# Patient Record
Sex: Male | Born: 1940 | Race: White | Hispanic: No | Marital: Married | Smoking: Never smoker
Health system: Southern US, Community
[De-identification: ages and names within clinical notes are randomized; demographics above are authoritative.]

## PROBLEM LIST (undated history)

## (undated) DIAGNOSIS — E119 Type 2 diabetes mellitus without complications: Secondary | ICD-10-CM

## (undated) DIAGNOSIS — E7800 Pure hypercholesterolemia, unspecified: Secondary | ICD-10-CM

## (undated) DIAGNOSIS — I2510 Atherosclerotic heart disease of native coronary artery without angina pectoris: Secondary | ICD-10-CM

## (undated) HISTORY — PX: TONSILLECTOMY: SUR1361

## (undated) HISTORY — PX: APPENDECTOMY: SHX54

## (undated) HISTORY — DX: Pure hypercholesterolemia, unspecified: E78.00

## (undated) HISTORY — DX: Type 2 diabetes mellitus without complications: E11.9

## (undated) HISTORY — PX: OTHER SURGICAL HISTORY: SHX169

## (undated) HISTORY — DX: Atherosclerotic heart disease of native coronary artery without angina pectoris: I25.10

---

## 1999-08-07 ENCOUNTER — Ambulatory Visit (HOSPITAL_BASED_OUTPATIENT_CLINIC_OR_DEPARTMENT_OTHER): Admission: RE | Admit: 1999-08-07 | Discharge: 1999-08-07 | Payer: Self-pay | Admitting: Plastic Surgery

## 1999-08-07 ENCOUNTER — Encounter (INDEPENDENT_AMBULATORY_CARE_PROVIDER_SITE_OTHER): Payer: Self-pay | Admitting: Specialist

## 2001-07-09 ENCOUNTER — Encounter: Payer: Self-pay | Admitting: Neurology

## 2001-07-09 ENCOUNTER — Encounter: Admission: RE | Admit: 2001-07-09 | Discharge: 2001-07-09 | Payer: Self-pay | Admitting: Infectious Diseases

## 2001-07-09 ENCOUNTER — Encounter: Admission: RE | Admit: 2001-07-09 | Discharge: 2001-07-09 | Payer: Self-pay | Admitting: Neurology

## 2001-07-13 ENCOUNTER — Encounter: Admission: RE | Admit: 2001-07-13 | Discharge: 2001-07-13 | Payer: Self-pay | Admitting: Infectious Diseases

## 2001-07-13 ENCOUNTER — Encounter: Payer: Self-pay | Admitting: Neurology

## 2001-07-13 ENCOUNTER — Encounter: Admission: RE | Admit: 2001-07-13 | Discharge: 2001-07-13 | Payer: Self-pay | Admitting: Neurology

## 2001-07-29 ENCOUNTER — Encounter: Admission: RE | Admit: 2001-07-29 | Discharge: 2001-07-29 | Payer: Self-pay | Admitting: Geriatric Medicine

## 2001-07-29 ENCOUNTER — Encounter: Payer: Self-pay | Admitting: Geriatric Medicine

## 2001-08-01 ENCOUNTER — Encounter (INDEPENDENT_AMBULATORY_CARE_PROVIDER_SITE_OTHER): Payer: Self-pay | Admitting: *Deleted

## 2001-08-01 ENCOUNTER — Inpatient Hospital Stay (HOSPITAL_COMMUNITY): Admission: AD | Admit: 2001-08-01 | Discharge: 2001-08-04 | Payer: Self-pay | Admitting: Internal Medicine

## 2001-08-01 ENCOUNTER — Encounter: Payer: Self-pay | Admitting: Internal Medicine

## 2001-08-03 ENCOUNTER — Encounter: Payer: Self-pay | Admitting: Cardiology

## 2001-08-11 ENCOUNTER — Encounter: Admission: RE | Admit: 2001-08-11 | Discharge: 2001-08-11 | Payer: Self-pay | Admitting: Geriatric Medicine

## 2001-08-11 ENCOUNTER — Encounter: Payer: Self-pay | Admitting: Geriatric Medicine

## 2001-08-16 ENCOUNTER — Encounter: Admission: RE | Admit: 2001-08-16 | Discharge: 2001-11-14 | Payer: Self-pay | Admitting: Geriatric Medicine

## 2001-08-31 ENCOUNTER — Ambulatory Visit (HOSPITAL_COMMUNITY): Admission: RE | Admit: 2001-08-31 | Discharge: 2001-08-31 | Payer: Self-pay | Admitting: Interventional Cardiology

## 2003-12-08 ENCOUNTER — Encounter: Admission: RE | Admit: 2003-12-08 | Discharge: 2003-12-08 | Payer: Self-pay | Admitting: Interventional Cardiology

## 2008-07-24 ENCOUNTER — Ambulatory Visit: Payer: Self-pay | Admitting: Pulmonary Disease

## 2008-07-24 ENCOUNTER — Ambulatory Visit: Payer: Self-pay | Admitting: Cardiothoracic Surgery

## 2008-07-24 ENCOUNTER — Ambulatory Visit: Payer: Self-pay | Admitting: Internal Medicine

## 2008-07-24 ENCOUNTER — Inpatient Hospital Stay (HOSPITAL_COMMUNITY): Admission: EM | Admit: 2008-07-24 | Discharge: 2008-08-01 | Payer: Self-pay | Admitting: Emergency Medicine

## 2008-07-25 ENCOUNTER — Encounter: Payer: Self-pay | Admitting: Cardiothoracic Surgery

## 2008-07-25 ENCOUNTER — Encounter: Payer: Self-pay | Admitting: Internal Medicine

## 2008-07-29 HISTORY — PX: OTHER SURGICAL HISTORY: SHX169

## 2008-08-15 ENCOUNTER — Encounter: Payer: Self-pay | Admitting: Internal Medicine

## 2008-08-15 ENCOUNTER — Encounter: Admission: RE | Admit: 2008-08-15 | Discharge: 2008-08-15 | Payer: Self-pay | Admitting: Cardiothoracic Surgery

## 2008-08-15 ENCOUNTER — Ambulatory Visit: Payer: Self-pay | Admitting: Cardiothoracic Surgery

## 2008-08-24 ENCOUNTER — Encounter (HOSPITAL_COMMUNITY): Admission: RE | Admit: 2008-08-24 | Discharge: 2008-09-27 | Payer: Self-pay | Admitting: Interventional Cardiology

## 2008-08-26 ENCOUNTER — Emergency Department (HOSPITAL_COMMUNITY): Admission: EM | Admit: 2008-08-26 | Discharge: 2008-08-26 | Payer: Self-pay | Admitting: Emergency Medicine

## 2008-08-27 ENCOUNTER — Ambulatory Visit (HOSPITAL_COMMUNITY)
Admission: RE | Admit: 2008-08-27 | Discharge: 2008-08-27 | Payer: Self-pay | Admitting: Thoracic Surgery (Cardiothoracic Vascular Surgery)

## 2008-08-31 ENCOUNTER — Ambulatory Visit (HOSPITAL_COMMUNITY): Admission: RE | Admit: 2008-08-31 | Discharge: 2008-08-31 | Payer: Self-pay | Admitting: Cardiothoracic Surgery

## 2008-08-31 ENCOUNTER — Encounter: Admission: RE | Admit: 2008-08-31 | Discharge: 2008-08-31 | Payer: Self-pay | Admitting: Cardiothoracic Surgery

## 2008-08-31 ENCOUNTER — Ambulatory Visit: Payer: Self-pay | Admitting: Cardiothoracic Surgery

## 2008-09-04 ENCOUNTER — Encounter: Admission: RE | Admit: 2008-09-04 | Discharge: 2008-09-04 | Payer: Self-pay | Admitting: Cardiothoracic Surgery

## 2008-09-07 ENCOUNTER — Encounter: Admission: RE | Admit: 2008-09-07 | Discharge: 2008-09-07 | Payer: Self-pay | Admitting: Cardiothoracic Surgery

## 2008-09-07 ENCOUNTER — Ambulatory Visit: Payer: Self-pay | Admitting: Cardiothoracic Surgery

## 2008-09-28 ENCOUNTER — Encounter (HOSPITAL_COMMUNITY): Admission: RE | Admit: 2008-09-28 | Discharge: 2008-10-03 | Payer: Self-pay | Admitting: Interventional Cardiology

## 2008-10-06 ENCOUNTER — Encounter: Admission: RE | Admit: 2008-10-06 | Discharge: 2008-10-06 | Payer: Self-pay | Admitting: Cardiothoracic Surgery

## 2008-10-06 ENCOUNTER — Ambulatory Visit: Payer: Self-pay | Admitting: Cardiothoracic Surgery

## 2010-07-09 LAB — BASIC METABOLIC PANEL
BUN: 12 mg/dL (ref 6–23)
BUN: 12 mg/dL (ref 6–23)
BUN: 13 mg/dL (ref 6–23)
CO2: 22 mEq/L (ref 19–32)
CO2: 25 mEq/L (ref 19–32)
CO2: 30 mEq/L (ref 19–32)
Calcium: 9.1 mg/dL (ref 8.4–10.5)
Calcium: 8.1 mg/dL — ABNORMAL LOW (ref 8.4–10.5)
Calcium: 8.3 mg/dL — ABNORMAL LOW (ref 8.4–10.5)
Calcium: 8.4 mg/dL (ref 8.4–10.5)
Chloride: 106 mEq/L (ref 96–112)
Chloride: 95 mEq/L — ABNORMAL LOW (ref 96–112)
Chloride: 98 mEq/L (ref 96–112)
Creatinine, Ser: 1.07 mg/dL (ref 0.4–1.5)
Creatinine, Ser: 1.18 mg/dL (ref 0.4–1.5)
Creatinine, Ser: 1.2 mg/dL (ref 0.4–1.5)
GFR calc Af Amer: >60 mL/min (ref >60)
GFR calc Af Amer: >60 mL/min (ref >60)
GFR calc Af Amer: >60 mL/min (ref >60)
GFR calc Af Amer: >60 mL/min (ref >60)
GFR calc non Af Amer: >60 mL/min (ref >60)
GFR calc non Af Amer: >60 mL/min (ref >60)
GFR calc non Af Amer: >60 mL/min (ref >60)
GFR calc non Af Amer: >60 mL/min (ref >60)
Glucose, Bld: 118 mg/dL — ABNORMAL HIGH (ref 70–99)
Glucose, Bld: 120 mg/dL — ABNORMAL HIGH (ref 70–99)
Glucose, Bld: 132 mg/dL — ABNORMAL HIGH (ref 70–99)
Glucose, Bld: 135 mg/dL — ABNORMAL HIGH (ref 70–99)
Potassium: 3.8 mEq/L (ref 3.5–5.1)
Potassium: 3.7 mEq/L (ref 3.5–5.1)
Potassium: 4.2 mEq/L (ref 3.5–5.1)
Potassium: 4.7 mEq/L (ref 3.5–5.1)
Sodium: 133 mEq/L — ABNORMAL LOW (ref 135–145)
Sodium: 127 mEq/L — ABNORMAL LOW (ref 135–145)
Sodium: 132 mEq/L — ABNORMAL LOW (ref 135–145)
Sodium: 135 mEq/L (ref 135–145)

## 2010-07-09 LAB — DIFFERENTIAL
Basophils Absolute: 0.1 10*3/uL (ref 0.0–0.1)
Eosinophils Relative: 2 % (ref 0–5)
Lymphocytes Relative: 17 % (ref 12–46)
Lymphs Abs: 1.4 10*3/uL (ref 0.7–4.0)
Monocytes Absolute: 0.6 10*3/uL (ref 0.1–1.0)
Neutro Abs: 6 10*3/uL (ref 1.7–7.7)

## 2010-07-09 LAB — CBC
HCT: 37.2 % — ABNORMAL LOW (ref 39.0–52.0)
HCT: 29.5 % — ABNORMAL LOW (ref 39.0–52.0)
HCT: 29.6 % — ABNORMAL LOW (ref 39.0–52.0)
HCT: 32.1 % — ABNORMAL LOW (ref 39.0–52.0)
HCT: 32.4 % — ABNORMAL LOW (ref 39.0–52.0)
Hemoglobin: 12.7 g/dL — ABNORMAL LOW (ref 13.0–17.0)
Hemoglobin: 10.3 g/dL — ABNORMAL LOW (ref 13.0–17.0)
Hemoglobin: 10.5 g/dL — ABNORMAL LOW (ref 13.0–17.0)
Hemoglobin: 11.1 g/dL — ABNORMAL LOW (ref 13.0–17.0)
Hemoglobin: 11.2 g/dL — ABNORMAL LOW (ref 13.0–17.0)
MCHC: 34.4 g/dL (ref 30.0–36.0)
MCHC: 35 g/dL (ref 30.0–36.0)
MCHC: 35 g/dL (ref 30.0–36.0)
MCHC: 35.4 g/dL (ref 30.0–36.0)
MCV: 89.5 fL (ref 78.0–100.0)
MCV: 90 fL (ref 78.0–100.0)
MCV: 90.2 fL (ref 78.0–100.0)
MCV: 90.3 fL (ref 78.0–100.0)
Platelets: 189 10*3/uL (ref 150–400)
Platelets: 189 10*3/uL (ref 150–400)
Platelets: 220 10*3/uL (ref 150–400)
Platelets: 252 10*3/uL (ref 150–400)
RBC: 3.27 MIL/uL — ABNORMAL LOW (ref 4.22–5.81)
RBC: 3.31 MIL/uL — ABNORMAL LOW (ref 4.22–5.81)
RBC: 3.56 MIL/uL — ABNORMAL LOW (ref 4.22–5.81)
RBC: 3.59 MIL/uL — ABNORMAL LOW (ref 4.22–5.81)
RDW: 13.6 % (ref 11.5–15.5)
RDW: 13.6 % (ref 11.5–15.5)
RDW: 13.7 % (ref 11.5–15.5)
RDW: 13.8 % (ref 11.5–15.5)
RDW: 13.9 % (ref 11.5–15.5)
WBC: 8.3 10*3/uL (ref 4.0–10.5)
WBC: 10.4 10*3/uL (ref 4.0–10.5)
WBC: 11.1 10*3/uL — ABNORMAL HIGH (ref 4.0–10.5)
WBC: 8.7 10*3/uL (ref 4.0–10.5)
WBC: 9.4 10*3/uL (ref 4.0–10.5)

## 2010-07-09 LAB — GLUCOSE, CAPILLARY
Glucose-Capillary: 119 mg/dL — ABNORMAL HIGH (ref 70–99)
Glucose-Capillary: 135 mg/dL — ABNORMAL HIGH (ref 70–99)
Glucose-Capillary: 141 mg/dL — ABNORMAL HIGH (ref 70–99)
Glucose-Capillary: 171 mg/dL — ABNORMAL HIGH (ref 70–99)
Glucose-Capillary: 183 mg/dL — ABNORMAL HIGH (ref 70–99)
Glucose-Capillary: 214 mg/dL — ABNORMAL HIGH (ref 70–99)

## 2010-07-09 LAB — BODY FLUID CELL COUNT WITH DIFFERENTIAL
Eos, Fluid: 1 %
Lymphs, Fluid: 58 %
Monocyte-Macrophage-Serous Fluid: 7 % — ABNORMAL LOW (ref 50–90)
Neutrophil Count, Fluid: 34 % — ABNORMAL HIGH (ref 0–25)

## 2010-07-09 LAB — POCT I-STAT, CHEM 8
BUN: 15 mg/dL (ref 6–23)
HCT: 32 % — ABNORMAL LOW (ref 39.0–52.0)
Hemoglobin: 10.9 g/dL — ABNORMAL LOW (ref 13.0–17.0)
Sodium: 129 mEq/L — ABNORMAL LOW (ref 135–145)
TCO2: 22 mmol/L (ref 0–100)

## 2010-07-09 LAB — URINE CULTURE
Colony Count: NO GROWTH
Culture: NO GROWTH

## 2010-07-09 LAB — CREATININE, SERUM
Creatinine, Ser: 0.95 mg/dL (ref 0.4–1.5)
GFR calc Af Amer: >60 mL/min (ref >60)
GFR calc non Af Amer: >60 mL/min (ref >60)

## 2010-07-09 LAB — PROTIME-INR: Prothrombin Time: 14.7 seconds (ref 11.6–15.2)

## 2010-07-09 LAB — CK TOTAL AND CKMB (NOT AT ARMC)
CK, MB: 3 ng/mL (ref 0.3–4.0)
Relative Index: 1.6 (ref 0.0–2.5)
Total CK: 185 U/L (ref 7–232)

## 2010-07-09 LAB — PROTEIN, BODY FLUID: Total protein, fluid: 4.3 g/dL

## 2010-07-09 LAB — MAGNESIUM
Magnesium: 2 mg/dL (ref 1.5–2.5)
Magnesium: 2.2 mg/dL (ref 1.5–2.5)

## 2010-07-09 LAB — BODY FLUID CULTURE: Culture: NO GROWTH

## 2010-07-09 LAB — APTT: aPTT: 33 seconds (ref 24–37)

## 2010-07-10 LAB — GLUCOSE, CAPILLARY
Glucose-Capillary: 115 mg/dL — ABNORMAL HIGH (ref 70–99)
Glucose-Capillary: 121 mg/dL — ABNORMAL HIGH (ref 70–99)
Glucose-Capillary: 131 mg/dL — ABNORMAL HIGH (ref 70–99)
Glucose-Capillary: 136 mg/dL — ABNORMAL HIGH (ref 70–99)
Glucose-Capillary: 140 mg/dL — ABNORMAL HIGH (ref 70–99)
Glucose-Capillary: 142 mg/dL — ABNORMAL HIGH (ref 70–99)
Glucose-Capillary: 142 mg/dL — ABNORMAL HIGH (ref 70–99)
Glucose-Capillary: 144 mg/dL — ABNORMAL HIGH (ref 70–99)
Glucose-Capillary: 146 mg/dL — ABNORMAL HIGH (ref 70–99)
Glucose-Capillary: 150 mg/dL — ABNORMAL HIGH (ref 70–99)
Glucose-Capillary: 175 mg/dL — ABNORMAL HIGH (ref 70–99)
Glucose-Capillary: 219 mg/dL — ABNORMAL HIGH (ref 70–99)

## 2010-07-10 LAB — COMPREHENSIVE METABOLIC PANEL
ALT: 27 U/L (ref 0–53)
AST: 26 U/L (ref 0–37)
Albumin: 3.1 g/dL — ABNORMAL LOW (ref 3.5–5.2)
Alkaline Phosphatase: 53 U/L (ref 39–117)
Alkaline Phosphatase: 63 U/L (ref 39–117)
BUN: 19 mg/dL (ref 6–23)
BUN: 9 mg/dL (ref 6–23)
CO2: 20 mEq/L (ref 19–32)
CO2: 24 mEq/L (ref 19–32)
Calcium: 8.6 mg/dL (ref 8.4–10.5)
Calcium: 8.8 mg/dL (ref 8.4–10.5)
Chloride: 106 mEq/L (ref 96–112)
Creatinine, Ser: 1.17 mg/dL (ref 0.4–1.5)
GFR calc Af Amer: >60 mL/min (ref >60)
GFR calc non Af Amer: 55 mL/min — ABNORMAL LOW (ref >60)
GFR calc non Af Amer: >60 mL/min (ref >60)
Glucose, Bld: 191 mg/dL — ABNORMAL HIGH (ref 70–99)
Glucose, Bld: 247 mg/dL — ABNORMAL HIGH (ref 70–99)
Potassium: 3.8 mEq/L (ref 3.5–5.1)
Sodium: 136 mEq/L (ref 135–145)
Total Bilirubin: 0.3 mg/dL (ref 0.3–1.2)
Total Protein: 6.2 g/dL (ref 6.0–8.3)
Total Protein: 6.3 g/dL (ref 6.0–8.3)

## 2010-07-10 LAB — CBC
HCT: 34 % — ABNORMAL LOW (ref 39.0–52.0)
HCT: 34.1 % — ABNORMAL LOW (ref 39.0–52.0)
HCT: 34.8 % — ABNORMAL LOW (ref 39.0–52.0)
HCT: 35.9 % — ABNORMAL LOW (ref 39.0–52.0)
HCT: 36.1 % — ABNORMAL LOW (ref 39.0–52.0)
HCT: 38.6 % — ABNORMAL LOW (ref 39.0–52.0)
HCT: 42.5 % (ref 39.0–52.0)
Hemoglobin: 11.9 g/dL — ABNORMAL LOW (ref 13.0–17.0)
Hemoglobin: 12 g/dL — ABNORMAL LOW (ref 13.0–17.0)
Hemoglobin: 12.6 g/dL — ABNORMAL LOW (ref 13.0–17.0)
Hemoglobin: 13.3 g/dL (ref 13.0–17.0)
Hemoglobin: 14.4 g/dL (ref 13.0–17.0)
MCHC: 34 g/dL (ref 30.0–36.0)
MCHC: 34.5 g/dL (ref 30.0–36.0)
MCHC: 34.9 g/dL (ref 30.0–36.0)
MCHC: 35 g/dL (ref 30.0–36.0)
MCHC: 35.2 g/dL (ref 30.0–36.0)
MCHC: 35.3 g/dL (ref 30.0–36.0)
MCV: 89.1 fL (ref 78.0–100.0)
MCV: 89.1 fL (ref 78.0–100.0)
MCV: 89.7 fL (ref 78.0–100.0)
MCV: 90.6 fL (ref 78.0–100.0)
Platelets: 168 10*3/uL (ref 150–400)
Platelets: 173 10*3/uL (ref 150–400)
Platelets: 177 10*3/uL (ref 150–400)
Platelets: 187 10*3/uL (ref 150–400)
Platelets: 238 10*3/uL (ref 150–400)
RBC: 3.8 MIL/uL — ABNORMAL LOW (ref 4.22–5.81)
RBC: 3.8 MIL/uL — ABNORMAL LOW (ref 4.22–5.81)
RBC: 3.83 MIL/uL — ABNORMAL LOW (ref 4.22–5.81)
RBC: 4.03 MIL/uL — ABNORMAL LOW (ref 4.22–5.81)
RBC: 4.26 MIL/uL (ref 4.22–5.81)
RDW: 13.5 % (ref 11.5–15.5)
RDW: 13.6 % (ref 11.5–15.5)
RDW: 13.7 % (ref 11.5–15.5)
RDW: 13.7 % (ref 11.5–15.5)
RDW: 13.9 % (ref 11.5–15.5)
RDW: 14 % (ref 11.5–15.5)
WBC: 10.1 10*3/uL (ref 4.0–10.5)
WBC: 10.6 10*3/uL — ABNORMAL HIGH (ref 4.0–10.5)
WBC: 11.7 10*3/uL — ABNORMAL HIGH (ref 4.0–10.5)
WBC: 9.2 10*3/uL (ref 4.0–10.5)
WBC: 9.9 10*3/uL (ref 4.0–10.5)

## 2010-07-10 LAB — DIFFERENTIAL
Basophils Absolute: 0.1 10*3/uL (ref 0.0–0.1)
Basophils Relative: 1 % (ref 0–1)
Basophils Relative: 1 % (ref 0–1)
Eosinophils Relative: 2 % (ref 0–5)
Lymphocytes Relative: 19 % (ref 12–46)
Lymphs Abs: 4.7 10*3/uL — ABNORMAL HIGH (ref 0.7–4.0)
Monocytes Relative: 7 % (ref 3–12)
Neutro Abs: 6.1 10*3/uL (ref 1.7–7.7)
Neutrophils Relative %: 52 % (ref 43–77)

## 2010-07-10 LAB — POCT I-STAT 3, ART BLOOD GAS (G3+)
Acid-Base Excess: 2 mmol/L (ref 0.0–2.0)
Acid-base deficit: 1 mmol/L (ref 0.0–2.0)
Bicarbonate: 22.8 mEq/L (ref 20.0–24.0)
Bicarbonate: 23.1 mEq/L (ref 20.0–24.0)
Bicarbonate: 26.4 mEq/L — ABNORMAL HIGH (ref 20.0–24.0)
O2 Saturation: 100 %
O2 Saturation: 99 %
Patient temperature: 34.6
Patient temperature: 35.1
Patient temperature: 37.4
TCO2: 24 mmol/L (ref 0–100)
TCO2: 27 mmol/L (ref 0–100)
pCO2 arterial: 40 mmHg (ref 35.0–45.0)
pH, Arterial: 7.366 (ref 7.350–7.450)

## 2010-07-10 LAB — TYPE AND SCREEN
ABO/RH(D): B POS
Antibody Screen: NEGATIVE

## 2010-07-10 LAB — BLOOD GAS, ARTERIAL
Acid-base deficit: 1.1 mmol/L (ref 0.0–2.0)
Acid-base deficit: 1.5 mmol/L (ref 0.0–2.0)
Bicarbonate: 20.3 mEq/L (ref 20.0–24.0)
Bicarbonate: 22.2 mEq/L (ref 20.0–24.0)
Bicarbonate: 22.5 mEq/L (ref 20.0–24.0)
Drawn by: 31436
FIO2: 0.21 %
FIO2: 1 %
O2 Saturation: 96.6 %
O2 Saturation: 98 %
PEEP: 5 cmH2O
Patient temperature: 98.6
Patient temperature: 98.6
RATE: 14 resp/min
TCO2: 23.6 mmol/L (ref 0–100)
pCO2 arterial: 31.2 mmHg — ABNORMAL LOW (ref 35.0–45.0)
pCO2 arterial: 33.8 mmHg — ABNORMAL LOW (ref 35.0–45.0)
pCO2 arterial: 34.2 mmHg — ABNORMAL LOW (ref 35.0–45.0)
pH, Arterial: 7.43 (ref 7.350–7.450)
pH, Arterial: 7.439 (ref 7.350–7.450)
pO2, Arterial: 435 mmHg — ABNORMAL HIGH (ref 80.0–100.0)
pO2, Arterial: 83.1 mmHg (ref 80.0–100.0)

## 2010-07-10 LAB — URINALYSIS, ROUTINE W REFLEX MICROSCOPIC
Bilirubin Urine: NEGATIVE
Glucose, UA: NEGATIVE mg/dL
Ketones, ur: NEGATIVE mg/dL
Leukocytes, UA: NEGATIVE
Nitrite: NEGATIVE
Protein, ur: NEGATIVE mg/dL
Specific Gravity, Urine: 1.011 (ref 1.005–1.030)
Urobilinogen, UA: 1 mg/dL (ref 0.0–1.0)
pH: 7 (ref 5.0–8.0)

## 2010-07-10 LAB — BASIC METABOLIC PANEL
BUN: 18 mg/dL (ref 6–23)
BUN: 7 mg/dL (ref 6–23)
CO2: 25 mEq/L (ref 19–32)
Calcium: 8.1 mg/dL — ABNORMAL LOW (ref 8.4–10.5)
Calcium: 8.2 mg/dL — ABNORMAL LOW (ref 8.4–10.5)
Calcium: 8.4 mg/dL (ref 8.4–10.5)
Chloride: 106 mEq/L (ref 96–112)
Chloride: 107 mEq/L (ref 96–112)
Chloride: 108 mEq/L (ref 96–112)
Creatinine, Ser: 1.01 mg/dL (ref 0.4–1.5)
Creatinine, Ser: 1.08 mg/dL (ref 0.4–1.5)
GFR calc Af Amer: >60 mL/min (ref >60)
GFR calc Af Amer: >60 mL/min (ref >60)
GFR calc non Af Amer: >60 mL/min (ref >60)
GFR calc non Af Amer: >60 mL/min (ref >60)
Glucose, Bld: 151 mg/dL — ABNORMAL HIGH (ref 70–99)
Potassium: 3.6 mEq/L (ref 3.5–5.1)
Potassium: 4.1 mEq/L (ref 3.5–5.1)
Sodium: 134 mEq/L — ABNORMAL LOW (ref 135–145)

## 2010-07-10 LAB — CK TOTAL AND CKMB (NOT AT ARMC)
CK, MB: 1.4 ng/mL (ref 0.3–4.0)
CK, MB: 2.7 ng/mL (ref 0.3–4.0)
Relative Index: 1.4 (ref 0.0–2.5)
Total CK: 102 U/L (ref 7–232)

## 2010-07-10 LAB — POCT CARDIAC MARKERS: Myoglobin, poc: 127 ng/mL (ref 12–200)

## 2010-07-10 LAB — CARDIAC PANEL(CRET KIN+CKTOT+MB+TROPI)
CK, MB: 2.8 ng/mL (ref 0.3–4.0)
CK, MB: 3.5 ng/mL (ref 0.3–4.0)
Relative Index: 2.6 — ABNORMAL HIGH (ref 0.0–2.5)
Total CK: 106 U/L (ref 7–232)
Total CK: 111 U/L (ref 7–232)
Total CK: 115 U/L (ref 7–232)
Troponin I: 0.1 ng/mL — ABNORMAL HIGH (ref 0.00–0.06)

## 2010-07-10 LAB — POCT I-STAT, CHEM 8
Chloride: 111 mEq/L (ref 96–112)
Creatinine, Ser: 1.1 mg/dL (ref 0.4–1.5)
Glucose, Bld: 131 mg/dL — ABNORMAL HIGH (ref 70–99)
Potassium: 4.4 mEq/L (ref 3.5–5.1)

## 2010-07-10 LAB — POCT I-STAT 4, (NA,K, GLUC, HGB,HCT)
Glucose, Bld: 87 mg/dL (ref 70–99)
Glucose, Bld: 92 mg/dL (ref 70–99)
Glucose, Bld: 93 mg/dL (ref 70–99)
HCT: 35 % — ABNORMAL LOW (ref 39.0–52.0)
HCT: 38 % — ABNORMAL LOW (ref 39.0–52.0)
Hemoglobin: 11.9 g/dL — ABNORMAL LOW (ref 13.0–17.0)
Hemoglobin: 11.9 g/dL — ABNORMAL LOW (ref 13.0–17.0)
Potassium: 3.4 mEq/L — ABNORMAL LOW (ref 3.5–5.1)
Potassium: 3.7 mEq/L (ref 3.5–5.1)
Sodium: 137 mEq/L (ref 135–145)
Sodium: 144 mEq/L (ref 135–145)

## 2010-07-10 LAB — PHOSPHORUS
Phosphorus: 1.9 mg/dL — ABNORMAL LOW (ref 2.3–4.6)
Phosphorus: 2.4 mg/dL (ref 2.3–4.6)

## 2010-07-10 LAB — URINE MICROSCOPIC-ADD ON

## 2010-07-10 LAB — ABO/RH: ABO/RH(D): B POS

## 2010-07-10 LAB — APTT
aPTT: 23 seconds — ABNORMAL LOW (ref 24–37)
aPTT: 29 seconds (ref 24–37)
aPTT: 30 seconds (ref 24–37)

## 2010-07-10 LAB — PROTIME-INR
INR: 1.1 (ref 0.00–1.49)
INR: 1.2 (ref 0.00–1.49)
INR: 1.2 (ref 0.00–1.49)
Prothrombin Time: 14.1 seconds (ref 11.6–15.2)
Prothrombin Time: 15.6 seconds — ABNORMAL HIGH (ref 11.6–15.2)

## 2010-07-10 LAB — MAGNESIUM
Magnesium: 2 mg/dL (ref 1.5–2.5)
Magnesium: 2 mg/dL (ref 1.5–2.5)
Magnesium: 2.1 mg/dL (ref 1.5–2.5)
Magnesium: 2.7 mg/dL — ABNORMAL HIGH (ref 1.5–2.5)

## 2010-07-10 LAB — BRAIN NATRIURETIC PEPTIDE: Pro B Natriuretic peptide (BNP): 241 pg/mL — ABNORMAL HIGH (ref 0.0–100.0)

## 2010-07-10 LAB — CREATININE, SERUM
Creatinine, Ser: 1 mg/dL (ref 0.4–1.5)
GFR calc Af Amer: >60 mL/min (ref >60)
GFR calc non Af Amer: >60 mL/min (ref >60)

## 2010-07-10 LAB — LACTIC ACID, PLASMA: Lactic Acid, Venous: 1.2 mmol/L (ref 0.5–2.2)

## 2010-07-10 LAB — HEMOGLOBIN A1C: Hgb A1c MFr Bld: 6.4 % — ABNORMAL HIGH (ref 4.6–6.1)

## 2010-07-10 LAB — POCT I-STAT GLUCOSE
Glucose, Bld: 101 mg/dL — ABNORMAL HIGH (ref 70–99)
Operator id: 3390

## 2010-08-13 NOTE — Consult Note (Signed)
NAMEJDEN, [name]            ACCOUNT NO.:  [id number]   MEDICAL RECORD NO.:  [id number]          PATIENT TYPE:  INP   LOCATION:  2911                         FACILITY:  [facility]   PHYSICIAN:  Doylene Canning. Ladona Ridgel, MD    DATE OF BIRTH:  [birth year]   DATE OF CONSULTATION:  07/25/2008  DATE OF DISCHARGE:                                 CONSULTATION   REQUESTING PHYSICIAN:  Lyn Records, MD   INDICATION FOR CONSULTATION:  Evaluation of VF arrest.   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  Apparently, he  had a pleuropericarditis back in 2003 and at that time underwent  catheterization where he was found to have subtotally occluded LAD with  preserved LV function.  The patient was treated with medical therapy  though he was not able take a beta-blocker secondary to relative  bradycardia.  The patient exercises regularly and was at his fitness  center when he subsequently became short of breath and fatigue and  slumped over.  He was found pulseless.  CPR was begun and he was treated  with an automatic external defibrillator and resuscitated him from VF.  He was admitted to the hospital and he underwent urgent catheterization  demonstrating the same subtotally occluded LAD and no other tight  stenoses.  He did have very minimal wall motion abnormality by report  with overall preserved LV function.  He is referred now for additional  evaluation.  The patient denied any symptoms.  His wife was with him  states that he has been more fatigued lately, but otherwise has been  stable.  He has never had frank syncope.  He denies peripheral edema.  He denies anginal symptoms.   PAST MEDICAL HISTORY:  Notable for diabetes diagnosed in 2003.  He has a  history of spontaneous pneumothorax in the past, history of  tonsillectomy, and history of wrist fracture in the past.   FAMILY HISTORY:  Notable for mother  died of breast cancer.  Father died  of Alzheimer dementia.   SOCIAL HISTORY:  The patient is a retired Midwife.  He denies  tobacco or ethanol abuse.  He is married.   REVIEW OF SYSTEMS:  As noted in the HPI, otherwise all systems reviewed  and negative except as noted above.   PHYSICAL EXAMINATION:  GENERAL:  He is a pleasant 70 year old man who  has no evidence of any obvious residual cephalopathy.  VITAL SIGNS:  The blood pressure was 109/60, the pulse was 60 and  regular, and the respirations were 18.  HEENT:  Normocephalic and atraumatic.  Pupils equal and round.  Oropharynx moist.  Sclerae anicteric.  NECK:  No jugular venous distention.  There is no thyromegaly.  Trachea  is midline.  The carotids are 2+ and symmetric.  LUNGS:  Clear bilaterally to auscultation.  No wheezes, rales, or  rhonchi are present.  No increased work of breathing.  CARDIAC:  Regular rate and rhythm.  Normal S1 and S2.  There are no  murmurs, rubs, or gallops.  PMI  was not enlarged nor was it laterally  displaced.  ABDOMEN:  Soft and nontender.  There is no organomegaly.  EXTREMITIES:  Demonstrate no cyanosis, clubbing, or edema.  The pulses  are 2+ and symmetric.  NEUROLOGIC:  Alert and oriented x3.  His cranial nerves are intact.  His  strength is 5/5 and symmetric.   LABORATORY DATA:  Creatinine of 1.3.  White count was slightly elevated.  His troponin and MB were normal.   IMPRESSION:  1. Out-of-hospital with ventricular fibrillation arrest.  2. Coronary artery disease with chronically occluded left anterior      descending despite preserved left ventricular function.  3. Initial residual encephalopathy, now apparently totally resolved.  4. Diabetes.   DISCUSSION:  I have discussed the treatment options with the patient and  his family.  I have recommended if at all possible coronary  revascularization will be recommended.  Following this, he will require  beta-blocker therapy.  His  amiodarone which has been given to him  intravenously can be discontinued.  Following recovery, an invasive EP  study would be recommended and if negative, exercise treadmill testing  to confirm that revascularization has resulted him not having exercise-  induced ventricular arrhythmias.  We will plan to follow the patient  with you.  Thanks for this interesting consult.      Doylene Canning. Ladona Ridgel, MD  Electronically Signed     GWT/MEDQ  D:  07/25/2008  T:  07/26/2008  Job:  [id number]

## 2010-08-13 NOTE — Op Note (Signed)
NAME:  [name], [name]            ACCOUNT NO.:  [id number]   MEDICAL RECORD NO.:  [id number]          PATIENT TYPE:  INP   LOCATION:  2310                         FACILITY:  [facility]   PHYSICIAN:  Sheliah Plane, MD    DATE OF BIRTH:  1941/03/13   DATE OF PROCEDURE:  07/28/2008  DATE OF DISCHARGE:                               OPERATIVE REPORT   PREOPERATIVE DIAGNOSIS:  Coronary occlusive disease with the ventricular  fibrillation arrest.   POSTOPERATIVE DIAGNOSIS:  Coronary occlusive disease with the  ventricular fibrillation arrest.   SURGICAL PROCEDURES:  Off-pump coronary artery bypass grafting x2 with  the left internal mammary to the left anterior descending coronary  artery and reverse saphenous vein graft to the first obtuse marginal  coronary artery with right thigh endovein harvesting.   SURGEON:  Sheliah Plane, MD   FIRST ASSISTANT:  Kerin Perna, MD   SECOND ASSISTANT:  Coral Ceo, PA   BRIEF HISTORY:  The patient is a 70 year old male with known coronary  occlusive disease involving the LAD.  He has been treated medically for  the past 7 years, doing well without symptoms, had a Vfib arrest while  exercising at a local gym.  He was successfully resuscitated and  underwent repeat cardiac catheterization demonstrating 40-50% left main  obstruction and subtotal occlusion of the LAD, but with obviously  preserved anterior wall function.  It was presumed that the arrest was  ischemic mediated because of ischemia of the anterior wall, possible  loss of collateral flow that had been present previously.  Because of  the presumed ischemic arrest, coronary artery bypass grafting was  recommended.  The patient's right coronary artery was with luminal  irregularities, but no significant stenosis.  The patient agreed and  signed informed consent.  Following revascularization, Dr. Ladona Ridgel will  continue to see the patient from an EP standpoint and make further  recommendations.   DESCRIPTION OF PROCEDURE:  With Swan-Ganz and arterial line monitors in  place, the patient underwent general endotracheal anesthesia without  incidence.  Skin of chest and leg was prepped with Betadine and draped  in the usual sterile manner.  Using the Guidant endovein harvesting  system, a segment of vein was harvested from the right thigh and was of  good quality and caliber.  Median sternotomy was performed.  Left  internal mammary artery was dissected down as pedicle graft.  The distal  arteries were divided and had good free flow.  Pericardium was opened.  Overall ventricular function appeared preserved.  The patient did have  significant pericardial adhesions, probably related to episode of  pleuritis and pericarditis in 2003.  The adhesions were taken down.  The  LAD was easily identified.  The first obtuse marginal was easily  identified.  It was decided to proceed with off-pump coronary artery  bypass grafting.  The patient was systemically heparinized.  Using the  Guidant stabilization device, attention was turned first to the first  obtuse marginal vessel.  The heart was elevated and stabilized.  Elastic  tapes were placed around the obtuse marginal coronary artery.  The  vessel  was opened and was of a good lumen.  Using a running 7-0 Prolene,  distal anastomosis was performed with second reverse saphenous vein  graft.  After the distal anastomosis was completed, the heart was  allowed to return to the chest.  Partial clamp was placed on the  ascending aorta.  Single-punch aortotomy was performed.  The vein was  trimmed to appropriate length and anastomosed with a running 6-0 Prolene  to the ascending aorta.  Air was evacuated from the graft and blood flow  was restored down to the vein graft.  The heart was then again elevated  and the site of anastomosis was isolated and proximal distal control  with elastic tapes was obtained.  The vessel was opened  using a running  8-0 Prolene left internal mammary artery and anastomosed to left  anterior descending coronary artery.  Blood flow was restored down the  mammary artery.  Fascia was tacked to the epicardium.  Heart was allowed  to return to the chest and the patient remained hemodynamically stable  throughout this.  Atrial and ventricular pacing wires were applied.  A  single graft marker was applied.  Protamine was administered.  A left  pleural tube and a Blake mediastinal drain were left in place.  Sternum  was closed with #6 stainless steel wire.  Fascia was closed with  interrupted running 3-0 Vicryl and the subcutaneous tissue, 4-0  subcuticular stitch in skin edges.  Dry dressings were applied.  Sponge  and needle count was reported as correct at the completion of the  procedure.  The patient tolerated the procedure without obvious  complication and was transferred to the Surgical Intensive Care Unit for  further postop care.      Sheliah Plane, MD  Electronically Signed     EG/MEDQ  D:  07/29/2008  T:  07/29/2008  Job:  [id number]   cc:   Lyn Records, M.D.  Doylene Canning. Ladona Ridgel, MD

## 2010-08-13 NOTE — H&P (Signed)
[name], [name]            ACCOUNT NO.:  [id number]   MEDICAL RECORD NO.:  [id number]          PATIENT TYPE:  INP   LOCATION:  2911                         FACILITY:  [facility]   PHYSICIAN:  Jonathan Balm. Sung Amabile, MD   DATE OF BIRTH:  1940/05/18   DATE OF ADMISSION:  07/24/2008  DATE OF DISCHARGE:                              HISTORY & PHYSICAL   ADMISSION DIAGNOSES:  1. Status post cardiac arrest.  2. Ventilator-dependent respiratory failure.   HISTORY OF PRESENT ILLNESS:  [name].  He has a past medical history of coronary artery disease  with a cardiac catheterization in 2003 demonstrating total occlusion of  the LAD.  He has been managed medically under the care [name].  On the morning of admission, he was at the gym working out on the  treadmill and complained of shortness of breath.  He stopped exercising  and subsequently was found unresponsive in the men's locker room.  An  AED on-site was applied and recommended defibrillation which was  administered.  Upon arrival by EMS, he had a pulse with normal sinus  rhythm and spontaneous respirations.  He is transported to the emergency  department at [facility] where he was extremely  combative requiring intubation to adequately provide care.  At the  present time, he is sedated and unable to provide history.  Family  members are unable to provide any further history.  Leading up to this  event, he was in his usual state of vigorous health.   PAST MEDICAL HISTORY:  As noted above, coronary artery disease.  Prior  history and physical also documents type 2 diabetes, but that admission  history and physical was from 2003.  It is unclear whether he is on  current medications for management of that.   CURRENT MEDICATIONS:  Unknown.   SOCIAL HISTORY:  Retired Lara.  Nonsmoker.   FAMILY HISTORY:  Noncontributory and unavailable.   REVIEW OF SYSTEMS:   Otherwise, unavailable.   PHYSICAL EXAMINATION:  VITAL SIGNS:  Afebrile, blood pressure 115/60,  pulse 100 and regular, respirations on the ventilator 22 and unlabored,  and oxygen saturation 100%.  GENERAL:  He is well developed, well nourished, sedated, and  unresponsive.  HEENT:  No evidence of trauma.  Pupils react to light and are symmetric.  He has roving eye movements.  Corneal reflex is intact.  NECK:  Supple without definite jugular venous distention.  CHEST:  Normal percussion note throughout.  Breath sounds are full  without wheezes or other adventitious sounds.  CARDIAC:  Regular rate and rhythm with no murmurs.  ABDOMEN:  Soft and nontender with normal bowel sounds.  EXTREMITIES:  Without clubbing, cyanosis, or edema.  NEUROLOGIC:  Spontaneous movement of all 4 extremities.  Prior to  sedation, he was excessively combative and unable to follow commands.  Cranial nerves appear to be intact.  Deep tendon reflexes were not  tested.   LABORATORY DATA:  Chest x-ray reveals no acute cardiac or pulmonary  disease.  Chemistries are normal except for a glucose of 247.  Cardiac  markers initially are negative.  CBC is unremarkable.  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   IMPRESSION:  1. Status post arrest - per history, this sounds most consistent with      ventricular fibrillation arrest.  Cardiology has been called and      the plan is for cardiac catheterization.  Further cardiovascular      and hemodynamic management will be in the hands of [name].  2. Ventilator-dependent respiratory failure - ventilator settings have      been established and ventilator bundle has been implemented.  Deep      vein thrombosis and stress ulcer prophylaxis are ordered.  I would      anticipate early extubation.  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  I      discussed with Dr.  Tyson Lara who does not believe that hypothermia      protocol is indicated in this particular case.  Further evaluation      and prognostication might require input from our Neurology service.  4. Hyperglycemia - ICU hyperglycemia protocol will be implemented.  5. A 45 minutes of Critical Care Medicine time was used in evaluation      and stabilization of this critically ill patient and does not      include the time spent placing central venous catheterization.      Jonathan Balm Sung Amabile, MD  Electronically Signed     DBS/MEDQ  D:  07/24/2008  T:  07/25/2008  Job:  [id number]   cc:   [name], M.D.  [name], M.D.

## 2010-08-13 NOTE — Cardiovascular Report (Signed)
NAME:  [name], [name] NO.:  [id number]   MEDICAL RECORD NO.:  [id number]          PATIENT TYPE:  INP   LOCATION:  2911                         FACILITY:  [facility]   PHYSICIAN:  Lyn Records, M.D.   DATE OF BIRTH:  [birth year]   DATE OF PROCEDURE:  07/24/2008  DATE OF DISCHARGE:                            CARDIAC CATHETERIZATION   INDICATIONS:  Out-of-hospital cardiac arrest.  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  The study  is being done after he suffered an out-of-hospital cardiac arrest.  There were no acute changes on his EKG.   PROCEDURES PERFORMED:  1. Left heart catheterization.  2. Selective coronary angiography.  3. Left ventriculography.   DESCRIPTION OF PROCEDURE:  Dr. Roxan Hockey was brought emergently to the  [facility] Lab.  Both groins were sterilely prepped and draped.  A 6-  French sheath was placed in the right femoral artery.  Coronary  angiography was performed using a 6-French A2 multipurpose catheter and  preformed left and right Judkins 6-French #4 catheters.  No  complications occurred.  The angiogram generally demonstrated anatomy,  essentially identical to that from 2000 and 2003.   Angio-Seal 6-French device was used with good hemostasis.   RESULTS:  1. Hemodynamic data:      a.     Aortic pressure 103/62.      b.     Left ventricular pressure 101/14.  2. Left ventriculography:  LV function is normal.  EF is 60%.  No      regional wall motion abnormality is seen.  3. Coronary angiography.      a.     Proximal left main 40%, no catheter damping was noted.      b.     Left anterior descending coronary:  Segmental proximal to       mid 90-99% stenosis with TIMI grade 2 flow.  The apical portion of       the LAD is not visualized.  There are left-to-left and also right-       to-left collaterals to the LAD.      c.      Circumflex artery:  The circumflex coronary artery contains       50% proximal narrowing.  Three small obtuse marginals arise       distally.      d.     Ramus branch:  The ramus intermedius is a large vessel that       arises from the distal left main and is widely patent.      e.     Right coronary:  The right coronary artery is widely patent       with luminal irregularities noted throughout the proximal and mid       vessel.  Septal perforator collaterals to the LAD are noted.       There is also a conus branch collaterals to the LAD.  The distal       LAD fills  by collaterals from the distal portion of the PDA.   CONCLUSIONS:  1. Normal left ventricular size and function.  No wall motion      abnormalities noted.  Ejection fraction of 60%.  2. Coronary atherosclerosis with chronic total occlusion of the left      anterior descending collateralized left-to-left and also right-to-      left.  3. 40% to 50% proximal left main irregularities are noted in the      proximal circumflex with up to 40% narrowing.  Irregularities are      noted in the mid and distal right coronary artery.  No high-grade      obstruction other than the left anterior descending is noted.   PLAN:  1. ICU  2. Vent management and weaning.  3. Neurological assessment.  4. IV nitroglycerin.  5. IV amiodarone.  6. Consider revascularization once neurological recovery is      documented.      Lyn Records, M.D.  Electronically Signed     HWS/MEDQ  D:  07/24/2008  T:  07/25/2008  Job:  [id number]   cc:   Hal T. Stoneking, M.D.

## 2010-08-13 NOTE — Op Note (Signed)
[name], [name]            ACCOUNT NO.:  [id number]   MEDICAL RECORD NO.:  [id number]          PATIENT TYPE:  INP   LOCATION:  2018                         FACILITY:  [facility]   PHYSICIAN:  Duke Salvia, MD, FACCDATE OF BIRTH:  26-Oct-1940   DATE OF PROCEDURE:  DATE OF DISCHARGE:                               OPERATIVE REPORT   PREOPERATIVE DIAGNOSIS:  Ventricular fibrillation.   POSTOPERATIVE DIAGNOSIS:  No inducible ventricular arrhythmia.   PROCEDURE:  Invasive electrophysiological study.   Following obtaining informed consent, the patient was brought to the  electrophysiology laboratory and placed on the fluoroscopic table in the  supine position.  After routine prep and drape, cardiac catheterization  was performed with local anesthesia and conscious sedation.  Noninvasive  blood pressure monitoring and transcutaneous oxygen saturation  monitoring and end-tidal CO2 monitoring were performed continuously  throughout the procedure.  Following the procedure, the catheters were  removed.  Hemostasis was obtained, and the patient was transferred to  the floor in stable condition.   Catheters of 5-French quadripolar catheter was inserted via the left  femoral vein to the AV junction.  A 5-French quadripolar catheter was inserted via the left femoral vein  to the right ventricular apex, moved to the right ventricular outflow  tract and then to the high right atrium.   Surface leads I, aVF, and V1 were monitored continuously throughout the  procedure.  Following insertion of the catheters, a stimulation protocol  included;  1. Incremental atrial pacing.  2. Incremental ventricular pacing.  3. Single and double atrial extrastimuli at a paced cycle length of      600 msec.  4. Single and double ventricular extrastimuli from the right      ventricular apex at a paced cycle length of 400:550 msec.  5. Double and triple ventricular extrastimuli from the right      ventricular  apex and the right ventricular outflow tract at paced      cycle length of 500 and 400 msec.   END-TIDAL RESULTS:  End-tidal surface electrocardiogram.  Rhythm:  Sinus.  RR interval:  810 msec;  PR interval:  187 msec;  QRS duration:  74 msec;  QT interval:  346 msec;  AH interval:  112 msec; HV interval:  48 msec.  AV nodal function:  AV Wenckebach was 360 msec.  VA conduction was dissociated at 600 msec.  AV nodal effective refractory period with a paced cycle length of 600  msec was less than or equal to 280 msec.  PR interval was longer than RR during the atrial incremental pacing.  Ventricular response to programmed stimulation:  The effective  refractory period of the paced cycle length of 550 msec from right  ventricular apex was 240 msec and at 400 msec was 230 msec.   The effective refractory period from the right ventricular outflow tract  at the paced cycle length of 500 msec was 270 msec and at 400 msec was  230 msec.   The coupling interval at the right ventricular apex and a paced cycle  length of 550 was 250, 200, 210 and  at 400 msec was 240, 200, 200.  The coupling interval at the right ventricular outflow tract and a paced  cycle length of 500 msec was 260, 210, 200, and at 400 msec was 230,  200, 200.   IMPRESSION:  1. Normal sinus function.  2. Normal atrial function.  3. Normal atrioventricular nodal function with evidence of PR longer      than RR and no VA conduction.  4. Normal His-Purkinje system function.  5. No accessory pathway.  6. Normal ventricular response to programmed stimulation.   SUMMARY:  In conclusion, the results of electrophysiological testing  failed to demonstrate a persistent substrate for malignant ventricular  arrhythmias.  The patient will be maintained on medical therapy and no  device implantation will be undertaken.      Duke Salvia, MD, [facility]  Electronically Signed     SCK/MEDQ  D:  07/31/2008  T:  08/01/2008   Job:  [id number]

## 2010-08-13 NOTE — Consult Note (Signed)
NAME:  [name], [name] NO.:  [id number]   MEDICAL RECORD NO.:  [id number]          PATIENT TYPE:  INP   LOCATION:  2911                         FACILITY:  [facility]   PHYSICIAN:  Sheliah Plane, MD    DATE OF BIRTH:  1940/07/05   DATE OF CONSULTATION:  07/25/2008  DATE OF DISCHARGE:                                 CONSULTATION   REQUESTING PHYSICIAN:  Lyn Records, MD   FOLLOWUP CARDIOLOGIST:  Lyn Records, MD   PRIMARY CARE PHYSICIAN:  Hal T. 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  He has been going to sport time to  exercise on a nearly daily basis, and yesterday after increasing his  usual exercise regimen to some degree by moving to stairmaster, had no  symptoms while exercising but after exercising went into the restroom  and collapsed, and AICD was in the facility and used quickly, a shock  was delivered and the review of the device showed the patient was found  to be in ventricular fibrillation.  He was somewhat combative in  transport, ultimately ended up being intubated to obtain a CT scan of  the head, which was clear.  He also underwent cardiac catheterization by  Dr. Verdis Prime yesterday.  He is now extubated and oriented.  Consideration for coronary artery bypass grafting is entertained and/or  consult requested by Dr. Katrinka Blazing.  He has had no prior history of known  myocardial infarction.  He does have a history of hypertension; history  of hyperlipidemia, well controlled, currently on Vytorin; and was  diagnosed with diabetes in 2003, hemoglobin A1c has been running between  5 and 7 on Actos and diet.  He has had no previous stroke, no  claudication, no renal insufficiency.   PAST MEDICAL HISTORY:  Significant for:  1. New  diagnosis of diabetes mellitus in 2003.  2. History of cardiac catheterization in 2003, found to have patent      coronary vessels with exception of the LAD, which was occluded and      filled by collaterals.  3. In 2003, the patient had approximately 55-month history of shortness      of breath, bilateral pneumonias, notes he was diagnosed with      pericarditis and a significant left pleural effusion.  This has      gradually resolved.   PAST SURGICAL HISTORY:  Significant for tonsillectomy, appendectomy.  He  had a spontaneous pneumothorax on the right side, treated with a chest  tube in 1963.   SOCIAL HISTORY:  The patient is married, a retired Midwife.   CURRENT MEDICATIONS:  1. Aspirin 81 mg day.  2. Actos 30 mg a day.  3. Vytorin 10/80.  4. Multivitamins.  However, the patient was not given any Plavix.   DRUG ALLERGIES:  TETRACYCLINE, which causes headache.   REVIEW OF SYSTEMS:  CARDIAC:  Negative for  chest pain.  Negative for  exertional shortness of breast.  Positive for syncopal episode  yesterday.  Denies presyncope, orthopnea, palpitation, or lower  extremity edema.  GENERAL:  The patient has noted some fatigue over the past several  months but denies chest pain or shortness of breath, denies fever,  chills, or night sweats.  No change in bowel habits.  No blood in his  urine or stool.  Denies psychiatric history.  Does have diabetes as  noted above.  Denies any easy bruisability.  Other than episodes in 2003  of question of pneumonia, he has had no infectious symptoms.  Other  review of systems are negative.   PHYSICAL EXAMINATION:  VITAL SIGNS:  The patient's weight is 82 kg,  height estimated at 5 feet 9 inch, blood pressure is 105/56, sinus  rhythm at 73, currently on amiodarone drip.  GENERAL:  The patient is awake, alert, neurologically intact, extubated  this morning, does not have carotid bruits.  LUNGS:  Clear bilaterally.  CARDIAC:  Regular rate and  rhythm.  ABDOMEN:  Benign.  LOWER EXTREMITIES:  Without edema.  He has a right subclavian vein  catheter currently in place.   LABORATORY FINDINGS:  White count of 11,000, hematocrit of 42, platelet  count 287,000.  Creatinine is 1.3.   Cardiac catheterization films were reviewed and discussed with Dr.  Katrinka Blazing.  He does have a minor degree of left main disease, but the vessel  is widely patent, estimated at 20-30% luminal irregularities in the  circumflex and right coronary artery, normal LV function, subtotal  occlusion of the LAD with distal filling of the vessel 90-99%.  There is  collateral filling of the distal LAD.   IMPRESSION:  The patient with presumed ischemic arrest with exercise  with subtotal occlusion of the left anterior descending, presumably  ischemic with exercise.  I discussed the case with Dr. Katrinka Blazing and agree  with recommendation to proceed with coronary artery bypass grafting to  the left anterior descending coronary artery and then discussed with the  Electrophysiology, with the patient not ischemic, an electrophysiology  study versus defibrillator placement postoperatively.  We will plan on  proceeding with surgery on this admission.  The patient currently is  medically stable and will give several days to recover from this episode  yesterday.  The risks of surgery including death, infection, stroke,  myocardial infarction, bleeding, blood transfusion all discussed with  the patient in detail, and he is willing to proceed.      Sheliah Plane, MD  Electronically Signed     EG/MEDQ  D:  07/25/2008  T:  07/26/2008  Job:  [id number]   cc:   Lyn Records, M.D.  Doylene Canning. Ladona Ridgel, MD

## 2010-08-13 NOTE — Assessment & Plan Note (Signed)
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  The patient did well  postoperatively.  He had originally presented with ventricular  fibrillation and local exercise facility was successfully resuscitated.  Postoperatively, he had an EP study by Dr. Ladona Ridgel and was not inducible,  so was discharged home on beta-blocker.  Since discharge, he has been  doing well.  I noticed he has had no chest pain or syncope or evidence  of congestive heart failure.  His respiratory status continues to  improve.  His overall endurance continues to improve.   PHYSICAL EXAMINATION:  On exam, his blood pressure is 102/62, pulse is  60, respiratory rate is 18, O2 sats 95%.  Sternum is stable and well  healed.  He has slight decrease in breath sounds on the left base, clear  on the right.  He has no pedal edema.  The right thigh endovein  harvesting site is healing well.   MEDICATIONS:  He continues on aspirin 325 mg a day, Toprol-XL 25 a day,  Actos 30 a day, Vytorin 10/80, multivitamins, Uroxatral, Colace p.r.n.,  and oxycodone p.r.n.   IMPRESSION AND PLAN:  He has a slightly increased left pleural effusion  and some left basilar atelectasis.  He has a history in 2003 requiring  thoracentesis after inflammatory process involving both chest with  moderate-sized left pleural effusion and probable pericarditis.  Currently, he has no symptoms of pericarditis.  The left pleural  effusion appears small.  He continues to diurese well.  At this point,  we will make no specific  intervention but in 2-3 weeks, we will have him  return to the office with a followup chest x-ray to make sure that the  effusion is gradually decreasing.  I have discussed this with him.  He  is aware of the fusion and will contact  us immediately should he note any increasing shortness of breath.  Plan  to see him back in 2-3 weeks.   Sheliah Plane, MD  Electronically Signed   EG/MEDQ  D:  08/15/2008  T:  08/16/2008  Job:  [id number]   cc:   Lyn Records, M.D.  Doylene Canning. Ladona Ridgel, MD

## 2010-08-13 NOTE — Assessment & Plan Note (Signed)
OFFICE VISIT   [name], [name]  DOB:  May 11, 1940                                        October 06, 2008  CHART #:  [id number]   The patient returns to the office today in followup after recent Vfib  and cardiac arrest and subsequent coronary artery bypass grafting x2,  which was done on July 28, 2008.  He has now completed his cardiac  rehab.  Noted that he had a high level stress test done last week with a  heart rate to 148 and was told that he had no ischemic changes.  His  wife note that he was fatigued following this.  He has had no angina,  continues on beta-blocker.  He returns to the office today for followup  chest x-ray because of early postoperative effusions to ensure that it  had completely resolved.   PHYSICAL EXAMINATION:  VITAL SIGNS:  His blood pressure 112/71, pulse  56, respiratory rate 16, and O2 sats 95%.  LUNGS:  Clear bilaterally.  CHEST:  Sternum is stable and well healed.  EXTREMITIES:  He has no pedal edema.   Followup chest x-ray shows almost total resolution of the left pleural  effusion.   Overall, I am pleased with his progress.  I have not made him a return  appointment to see me, but would be glad to see him at his or Dr.  Michaelle Copas request at anytime.   Sheliah Plane, MD  Electronically Signed   EG/MEDQ  D:  10/06/2008  T:  10/07/2008  Job:  [id number]   cc:   Lyn Records, M.D.

## 2010-08-13 NOTE — Consult Note (Signed)
NAME:  ROBBY, BULKLEY NO.:  [id number]   MEDICAL RECORD NO.:  [id number]          PATIENT TYPE:  INP   LOCATION:  2911                         FACILITY:  [facility]   PHYSICIAN:  Lyn Records, M.D.   DATE OF BIRTH:  1940-08-22   DATE OF CONSULTATION:  07/24/2008  DATE OF DISCHARGE:                                 CONSULTATION   CHIEF COMPLAINT:  Cardiac arrest.   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  Normal EF.  Apparently, he was at the gym today on the treadmill  and started feeling more short of breath than usual.  He went into the  locker room and then was found unresponsive.  An AED was applied, and a  shockable rhythm was identified.  He was defibrillated and brought to  [facility] Emergency Room, intubated and sedated.  EKG is not acute.  He is  being taken to the catheterization lab for possible intervention.  The  patient is intubated and sedated, I cannot gather complete information  at this time.   PAST MEDICAL HISTORY:  1. CAD.  His last cath in 2003.  2. Diabetes mellitus.  3. Spontaneous pneumothorax secondary to bleb.  4. History of tonsillectomy and appendectomy.   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  His son is present.   FAMILY HISTORY:  Mom died of breast cancer at age 19.  Dad died at age  64 with Alzheimer disease.   ALLERGIES:  We have uncovered is that he has a headache with  TETRACYCLINE.   CURRENT MEDICATIONS:  Unknown.   PHYSICAL EXAMINATION:  GENERAL:  He is intubated and sedated.  VITAL SIGNS:  Pulse 75, respirations 14, and blood pressure 131/66.  HEENT:  Grossly normal.  HEART:  Regular rate and rhythm.  No gross murmur.  LUNGS:  Clear to  auscultation bilaterally.  ABDOMEN:  Nondistended.  No obvious tenderness.  EXTREMITIES:  No peripheral edema.  NEUROLOGIC:  Again sedated.   Head CT is negative for any acute process.  Chest x-ray shows  atelectasis changes in the left base.   LABORATORY DATA:  Hemoglobin 14.4, hematocrit 42.5, white count 11.8,  and platelets 287.  Sodium 136, potassium 3.8, BUN 19, creatinine 1.30,  glucose 247.  Point-of-care markers negative x1.   This is a 70 year old gentleman with out-of-hospital cardiac arrest.  It  is felt that he has a cardiac source as the reason for his arrest today,  therefore, he is being taken to the cardiac catheterization lab on an  emergent basis.  Permission has been obtained from his son to go ahead  and proceed with emergent procedure.       Guy Franco, P.A.      Lyn Records, M.D.  Electronically Signed    LB/MEDQ  D:  07/24/2008  T:  07/25/2008  Job:  [id number]   cc:   Lyn Records, M.D.

## 2010-08-13 NOTE — Procedures (Signed)
EEG NUMBER:  [id number]   CLINICAL HISTORY:  The patient is a 70 year old admitted after being  found unresponsive on a locked room floor after working out.  He  received defibrillation on his way to the hospital and coming to, he was  combative.  He was intubated, was able to be extubated for the EEG and  is alert and oriented.  Study is being done to look for the presence of  seizures (780.2).   PROCEDURE:  The tracing is carried out on a 32-channel digital Cadwell  recorder reformatted into 16-channel montages with one devoted to EKG.  The patient was awake and drowsy during the recording.  The  [employer] 10/20 system lead placement was used.   MEDICATIONS:  Ativan, aspirin, NovoLog, Protonix, Versed, Cordarone,  fentanyl.   DESCRIPTION OF FINDINGS:  Dominant frequency is a 9-10 Hz alpha range  activity that is well regulated.  The patient comes drowsy with mixed  frequency theta and beta range activity and upper delta range activity.  There is no focal slowing.  There is no interictal epileptiform activity  in the form of spikes or sharp waves.   EKG showed a regular sinus rhythm with ventricular response of 72 beats  per minute.   IMPRESSION:  Normal record with the patient awake and drowsy.      Deanna Artis. Sharene Skeans, M.D.  Electronically Signed     TDV:VOHY  D:  07/25/2008 22:07:32  T:  07/26/2008 04:29:20  Job #:  [id number]   cc:   Pramod P. Pearlean Brownie, MD  Fax: [phone]

## 2010-08-13 NOTE — Assessment & Plan Note (Signed)
OFFICE VISIT   SILVIA, MARKUSON  DOB:  1940/04/06                                        September 07, 2008  CHART #:  [id number]   The patient returns to the office today for followup chest x-ray after  he was seen in the office last week with persistent left pleural  effusion.  One week ago, the effusion was tapped and additional 1500 mL  was withdrawn.  Since that time, he has felt much better, involved in  cardiac rehab without difficulty.  Denies any chest pain or shortness of  breath.  He returns today for followup chest x-ray.   PHYSICAL EXAMINATION:  VITAL SIGNS:  Blood pressure 115/67, pulse is 58,  respiratory rate is 18, and O2 sats 97%.  LUNGS:  Clear bilaterally without wheezing.  CHEST:  Sternum is stable and well healed.  EXTREMITIES:  He has no pedal edema.   Followup chest x-ray shows clear lung fields without any evidence of re-  accumulation of effusion.  Overall, he is much improved; wishes not to  continue full 12 weeks of cardiac rehab, but start on his own.  I have  encouraged him to wait until after he sees Dr. Katrinka Blazing in 2 weeks and  decide on followup stress test before exercising on his own, not in  cardiac rehab.  Overall, I am very pleased with his progress.  I will  plan to see him back between 4 and 6 weeks with a followup chest x-ray.  He is aware of the symptoms of increasing pleural effusion and will  contact us immediately should he has this.   Sheliah Plane, MD  Electronically Signed   EG/MEDQ  D:  09/07/2008  T:  09/07/2008  Job:  [id number]   cc:   Lyn Records, M.D.

## 2010-08-13 NOTE — Discharge Summary (Signed)
[name], [name]            ACCOUNT NO.:  [id number]   MEDICAL RECORD NO.:  [id number]          PATIENT TYPE:  INP   LOCATION:  2018                         FACILITY:  [facility]   PHYSICIAN:  Sheliah Plane, MD    DATE OF BIRTH:  05-Oct-1940   DATE OF ADMISSION:  07/24/2008  DATE OF DISCHARGE:  08/01/2008                               DISCHARGE SUMMARY   PRIMARY ADMITTING DIAGNOSIS:  Cardiac arrest.   ADDITIONAL/DISCHARGE DIAGNOSES:  1. Ventricular fibrillation cardiac arrest.  2. Recurrent coronary artery disease.  3. History of diabetes mellitus.  4. Prior history of coronary artery disease with a history of total      occlusion of the left anterior descending.  5. Hyperlipidemia.  6. Ventilator-dependent respiratory failure.   PROCEDURES PERFORMED:  1. Cardiac catheterization.  2. Off-pump coronary artery bypass grafting x2 (left internal mammary      artery to the LAD, saphenous vein graft to the second obtuse      marginal).  3. Endoscopic vein harvest, right thigh.   HISTORY:  The patient is a 70 year old male with a known history of  coronary artery disease.  He is status post previous cardiac  catheterization in 2003 which showed a total occlusion of the LAD.  He  had been managed medically since that time and is followed by Dr. Verdis Prime.  On the morning of his admission, he was working out at the gym  on the treadmill and developed shortness of breath.  He stopped  exercising and subsequently was found unresponsive in the men's locker  room.  An AED OnSite was applied, and the patient was defibrillated x1.  EMS found him with a pulse and normal sinus rhythm and spontaneous  respirations.  He was subsequently transferred to [facility]  and was admitted for further evaluation and treatment.   HOSPITAL COURSE:  Upon arrival to the ED at [facility], he was found to  be combative and unable to protect his airway.  He did require  intubation and was  subsequently followed by Pulmonary and Critical Care  Medicine for ventilator management.  He was also seen by Cardiology and  it was felt that he should undergo cardiac catheterization.  This was  performed on July 24, 2008, and he was found to have chronically  occluded LAD, 40% left main stenosis, patent right coronary artery and  circumflex systems.  Ejection fraction was around 60% with no wall  motion abnormalities.  It was felt that once he stabilize he would  ultimately require coronary artery bypass surgery for definitive  management.  He remained stable and was extubated on July 25, 2008.  He  was followed by Neurology to assess for any focal deficits following his  loss of consciousness and cardiac arrest.  He underwent an EEG which was  normal.  He was found to be back in his baseline mental status following  extubation.  He was seen in consultation by Dr. Sheliah Plane and  films were reviewed.  Dr. Tyrone Sage agreed that he would benefit from  surgical revascularization at this time.  He explained  all risks,  benefits, and alternatives of surgery to the patient, he agreed to  proceed.  It was also felt that during his admission he should be  evaluated for possible AICD placement.  Dr. Lewayne Bunting saw the patient  and felt that he should initially proceed with his CABG surgery and  undergo an EP study during his postoperative recovery.  All risks,  benefits, and alternatives of surgery were explained to the patient, and  he agreed to proceed.  His preoperative workup showed normal ABIs and  carotid Dopplers which were negative for ICA stenosis.  He was taken to  the operating room on July 28, 2008, and underwent off-pump coronary  artery bypass grafting x2 as described above.  Please see previously  dictated operative report for complete details of surgery.  He tolerated  the procedure well and was transferred to the SICU in stable condition.  He was extubated shortly  after surgery.  He was hemodynamically stable  and doing well on postop day #1.  At that time, his tubes and monitoring  devices were removed.  He was kept in the unit until postop day 2.  At  that point, he was stable and doing well and was able to be transferred  to the step-down unit.  Postoperatively, he has been restarted on a  statin as well as aspirin and started on a beta-blocker.  He has been  spontaneously diuresing and is back down around his preoperative weight  with no significant evidence of volume overload on physical exam.  He  has been followed postoperatively by Dr. Ladona Ridgel and has undergone an EP  study on Jul 31, 2008.  There was no evidence of inducible V-tach of V-  fib, and it was felt that he would not need further evaluation or  placement of an ICD at this time.  Overall, his postoperative course has  been uneventful.  He has been ambulating in the halls without difficulty  with Cardiac rehab phase 1 as well as independently.  He has remained  afebrile and vital signs have been stable.  His incisions are all  healing well.  His blood sugars have been under good control on his home  medications.  His labs on postop day 3 showed a hemoglobin of 10.3,  hematocrit 29.5, platelets 189, white count 8.7, sodium 132, potassium  4.7, BUN 12, creatinine 1.18.  Chest x-ray showed right basilar  atelectasis.  Since he has progressed well and has had no postoperative  complications, it is felt that he can be discharged home on postop day 4  which is Aug 01, 2008.   Discharge medications are as follows.  1. Enteric-coated aspirin 325 mg daily.  2. Toprol-XL 25 mg daily.  3. Oxycodone 10 mg q.3 hours p.r.n. for pain.  4. Actos 30 mg daily.  5. Vytorin 10/80 mg daily.  6. Multivitamin 1 daily.   DISCHARGE INSTRUCTIONS:  He is asked to refrain from driving, heavy  lifting, or strenuous activity.  He may continue ambulating daily and  using his incentive spirometer.  He may  shower daily and clean his  incisions with soap and water.  He will continue a low-fat, low-sodium,  carbohydrate-modified, medium-calorie diet.   DISCHARGE FOLLOWUP:  He will see Dr. Katrinka Blazing in the office in 2 weeks.  He  will then follow up in 3 weeks with Dr. Tyrone Sage with a chest x-ray 30  minutes prior to this appointment.  He will follow up as directed  with  Dr. Ladona Ridgel.  In the interim, if he experiences any problems or has  questions, he is asked to contact our office immediately.      Coral Ceo, P.A.      Sheliah Plane, MD  Electronically Signed   GC/MEDQ  D:  08/01/2008  T:  08/02/2008  Job:  [id number]   cc:   Lyn Records, M.D.  Jonelle Sports. Cheryll Cockayne, M.D.  Doylene Canning. Ladona Ridgel, MD  [facility] Office.

## 2010-08-13 NOTE — Consult Note (Signed)
NAMEFILIBERTO, [name]            ACCOUNT NO.:  [id number]   MEDICAL RECORD NO.:  [id number]          PATIENT TYPE:  INP   LOCATION:  2911                         FACILITY:  [facility]   PHYSICIAN:  Pramod P. Pearlean Brownie, MD    DATE OF BIRTH:  Oct 24, 1940   DATE OF CONSULTATION:  07/24/2008  DATE OF DISCHARGE:                                 CONSULTATION   REFERRING PHYSICIAN:  Felipa Evener, MD   REASON FOR REFERRAL:  Prognosis after cardiac arrest.   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  Automatic defibrillator was applied x1  and his pulse was reported.  EMS found him to be normal sinus rhythm.  He was started on IV and took him into the emergency room where he was  found to be combative and apparently posturing.  He was intubated and  sedated and calm down.  He was subsequently taken to cardiac cath lab  where an angiogram was performed, but no correctable coronary vascular  stenosis was found.  He has been sedated with Versed and fentanyl.  Since then, there have been no witnessed seizure activity, myoclonic  jerks, or tonic posturing noted.   PAST MEDICAL HISTORY:  Positive only for diabetes, which is apparently  well controlled.   HOME MEDICATIONS:  Actos.   PHYSICAL EXAMINATION:  GENERAL:  A middle-aged Caucasian male, is  currently not in distress.  VITAL SIGNS:  Afebrile, pulse rate 61 per minute on amiodarone drip,  blood pressure is 114/64, respiratory rate is 18 per minute.  Distal  pulses were well felt.  HEAD:  Nontraumatic.  ENT:  Unremarkable.  NECK:  Supple.  There is no bruit.  CARDIAC:  No murmur or gallop.  LUNGS:  Clear to auscultation.  ABDOMEN:  Soft and nontender.  NEUROLOGIC:  The patient is sedated, but he can be aroused with sternal  rub or  nailbed pressure.  He does follow commands.  He  consistently on  both sides.  He is moving all 4 extremities purposefully against  gravity.  Reflexes were 1+ brisk.  Plantars are downgoing.   DATA REVIEWED:  Noncontrast CAT scan of the head looks unremarkable, age-  appropriate changes of small vessel disease are noted.  Cardiac enzymes;  first set is negative.  BMP is slightly elevated.   IMPRESSION:  A 70 year old gentleman with unwitnessed episode of loss of  consciousness, presumably of cardiac arrhythmia, which responded to  cardioversion x1.  He had a period of agitation after that requiring  sedation and intubation.  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   PLAN:  I would recommend no further neurological testing at the present  time, minimize his sedation, I extubate him if tolerated.  We will do  further  neurological testing when he is more awake.  I had a long  discussion with the patient's wife and his children and answered  questions.  Kindly call for questions.           ______________________________  Sunny Schlein. Pearlean Brownie, MD     PPS/MEDQ  D:  07/24/2008  T:  07/25/2008  Job:  [id number]

## 2010-08-13 NOTE — Assessment & Plan Note (Signed)
OFFICE VISIT   MANDY, FITZWATER  DOB:  [birth year]                                        August 31, 2008  CHART #:  [id number]   The patient underwent coronary artery bypass grafting x2 with left  internal mammary to the LAD after he had a hospital cardiac arrest on 4,  surgery was done on 07/28/2008.  Postoperatively, he did well.  Early  postoperative chest x-ray showed a small left pleural effusion.  This  was being monitored however, before he returned with a followup film, he  developed increasing shortness of breath and left chest discomfort and  was found to have a large left pleural effusion over Memorial Day  weekend.  Thoracentesis was done and after approximately 1900 mL of  fluid was withdrawn, thoracentesis was stopped.  He returns today for  followup film and still has some residual left pleural effusion at least  moderate on the left.  Overall, he feels much better.  His shortness of  breath has resolved and he started in cardiac rehab.  He has had no  fever, chills, night sweats, or other symptoms of postpericardiotomy  syndrome.  He does have a history of previous pleural effusions in 2003,  which required thoracentesis.   On exam today, his blood pressure 116/81, pulse 68, respiratory rate is  18, and O2 sat is 97%.  His sternum is stable and well healed.  Right  lung is completely clear.  He has decreased breath sounds at the left  base.  The upper portion of the left lung is clear.  He has no pedal  edema.   Followup chest x-ray shows at least moderate left pleural effusion, may  be slightly increased from the post-thoracentesis film, but much  improved from the previous film.  However with moderate pleural effusion  still there, it is unclear if this was just residual or not, or is  reaccumulating.  I have recommended to him that we perform a repeat  thoracentesis to drain the left side as dry as possible and then if he  has  recurrence of this, consider the placement of a Pleurx catheter on  the left temporarily.  The cytology and cultures on the pleural fluid  already drawn, all 4 negative.  The patient is agreeable with this.  I  made a followup chest x-ray and  thoracentesis today and a followup chest x-ray on Monday, and then see  me back in the office in 1 week.   Sheliah Plane, MD  Electronically Signed   EG/MEDQ  D:  08/31/2008  T:  08/31/2008  Job:  [id number]   cc:   Lyn Records, M.D.

## 2010-08-16 NOTE — Cardiovascular Report (Signed)
. [facility]  Patient:    [name], [name] Visit Number: [id number] MRN: [id number]          Service Type: CAT Location: [facility] 2852 01 Attending Physician:  Lyn Records. Iii Dictated by:   Darci Needle, M.D. Proc. Date: 08/31/01 Admit Date:  08/31/2001   CC:         Hal T. Stoneking, M.D.   Cardiac Catheterization  INDICATION FOR PROCEDURE: Abnormal Cardiolite, diabetes mellitus, wall motion abnormality on echocardiography.  PROCEDURES PERFORMED: 1. Left heart catheterization. 2. Selective coronary angiography. 3. Left ventriculography.  RESULTS:  I: HEMODYNAMIC DATA:     a. The aortic pressure 111/69 mmHg.     b. Left ventricular pressure 111/20 mmHg.  II: LEFT VENTRICULOGRAPHY: The left ventricle is normal in size and demonstrates normal contractility. Regional wall motion is normal. EF is 65%. No MR.  III: SELECTIVE CORONARY ANGIOGRAPHY:     a. Left main coronary: Patent.     b. Left anterior descending coronary: The LAD contains severe high-grade        disease starting at the left main and extending to the mid vessel just        proximal to the origin of a large first septal perforator. Obstruction        in this region is from 80 to 99%. The distal LAD is not seen to fill        distally by antegrade flow. The LAD does fill by right to left        collaterals via the septal perforator and around the apex and is seen]        on right coronary injection.     c. Circumflex artery: The circumflex artery is large, gives origin to two        large obtuse marginal branches. The first obtuse marginal branch arises        essentially as a ramus branch. No significant obstruction is noted. The        mid and distal circumflex contains 20-40% narrowing. No significant        high-grade obstruction is noted.     d. Right coronary: The right coronary artery contains luminal        irregularities, proximal and mid.  The PDA  contains no significant        obstruction. Collaterals to the LAD fill via the first septal        perforator. The continuation of the RCA is large given origin to a        large LV branch. There is 30% narrowing of the ostium of the LV        branch. The distal LAD fills late by collaterals via the septal        perforator.  CONCLUSIONS: 1. 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 2. Luminal irregularities in the circumflex and right coronary. 3. Overall normal left ventricular function.  RECOMMENDATIONS: Aggressive risk factor modification and medical therapy versus MID-CAB. Dictated by:   Darci Needle, M.D. Attending Physician:  Lyn Records. Iii DD:  08/31/01 TD:  08/31/01 Job: [id number]

## 2010-08-16 NOTE — Discharge Summary (Signed)
[place]  Patient:    [name], [name] Visit Number: [id number] MRN: [id number]          Service Type: MED Location: 2000 2013 01 Attending Physician:  Ginette Otto Dictated by:   Hal T. Stoneking, M.D. Admit Date:  08/01/2001 Discharge Date: 08/04/2001   CC:         Fransisco Hertz, M.D.  Dewayne Shorter, M.D.  Genene Churn. Love, M.D.  Darci Needle, M.D.   Discharge Summary  ADMITTING DIAGNOSIS:  Pleural effusion with fever.  DISCHARGE DIAGNOSES: 1. Probable parapneumonic effusion versus resolving empyema. 2. Pneumonia, community-acquired. 3. Recent onset of diabetes mellitus.  PROCEDURE:  Thoracentesis.  CONSULTATION:  Infectious disease with Dr. Cliffton Asters and Dr. Roxan Hockey.  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  He has enjoyed excellent health until approximately two months ago.  At that time, he presented to Dr. Melbourne Abts with a respiratory illness. Shortly thereafter, he saw Dr. Darlina Sicilian.  He was placed on Tequin on at least two occasions.  He appeared to improve his clinical course.  He was then seen in our clinic approximately five days ago.  He was restarted on Tequin because of recurrent fevers and a CT scan showing bilateral effusions and a mild to moderate pericardial effusion.  During this time, he had no sharp chest pain or pleuritic chest pain.  However, over the next two days, he continued to have fevers up to 102.  He continued to have a dry, hacking cough and was also on ibuprofen which upset his stomach and obviously did not change the course of his illness.  He was brought into the hospital for further evaluation and treatment.  PHYSICAL EXAMINATION:  VITAL SIGNS:  Temperature 97, blood pressure 140/80, pulse 100, respiratory rate 18, O2 saturations on room air 97%.  HEENT:  Unremarkable.  LUNGS:  Revealed decreased breath sounds at the  bases, left greater than right.  A few crackles were heard.  HEART:  Regular rate and rhythm without murmur.  ABDOMEN:  Soft, no hepatosplenomegaly or masses palpated.  LABORATORY DATA AND X-RAY FINDINGS:  Chest x-ray revealed bilateral pleural effusions with left greater than right.  White count 10,700, hemoglobin 11.5, platelet count 383,000; 95% neutrophils, 16% lymphs, 9% monos.  Sedimentation rate elevated at 95, protein 16.  INR 1.3, PTT 33.  Sodium 130, potassium 4.1, chloride 103, bicarb 20, glucose 185, BUN 9, creatinine 1.1, calcium 8.5.  Total protein 6.2.  Albumin 2.6.  AST 45, ALT 66, Alk phos 315, total bilirubin 1.5.  LDH 308.  Amylase 54.  TSH 2.472. CRP elevated at 16, although this had been normal as an outpatient approximately two to three weeks earlier.  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  He was seen in consultation, by Dr. Burnice Logan who felt that his antibiotics should be discontinued and that we should observe.  He also agreed that he should have a thoracentesis.  Thoracentesis was performed in radiology under ultrasound.  Approximately 1100 cc of fluid were obtained.  Evaluation of fluid revealed it to be an exudate.  It was cloudy and had a white count of 3740, mostly lymphs at 73%, the rest were neutrophils, protein 3.6, glucose 204, cholesterol 111, pH 7.6, LDH 113, amylase 30.  The Gram stain and cultures from the fluid were negative.  Blood cultures were negative. Cytology remarkable only for reactive  cells, no malignant cells were seen.  No yeast or fungal elements seen in the fluid as well.  An ANA was done which was negative.  Rheumatoid factor and ANA on the fluid currently are pending.  He also underwent a 2D echocardiogram which revealed there to be a trivial pericardial effusion, however, his ejection fraction was 55-65%.  It was a technically difficult study.  There was a question of inferior  apical hypokinesis.  During his hospitalization, he develop MAT which was asymptomatic and only for short runs.  There were none 24 hours prior to discharge and this was felt to be due to cardiac irritation from the pulmonary process.  His EKG did reveal loss of R wave in V1 and V2, but no acute ST and T wave changes.  A follow up chest x-ray, a day after the pleural effusion, was tapped and revealed resolution of the fluid and, in fact, the small effusion on the right was also improved.  #2 - DIABETES:  His Actos was increased from 15-30 mg.  His sugars were gradually coming down.  His fasting sugar was 190 at the time of discharge. It is felt as his pulmonary process improves, that his sugars will probably drift down as well.  He will be checking his sugars twice a day at home as well as following his temperature.  DISPOSITION:  Discharged to home.  CONDITION ON DISCHARGE:  Improved.  DISCHARGE MEDICATION:  Actos 30 mg a day.  FOLLOWUP:  He will follow up in the office on May 14, for repeat PA and lateral chest x-ray, CBC and liver panel.  He then has an appointment in the office on May 15.  DISCHARGE LABORATORY DATA AND X-RAY FINDINGS:  Of note, liver transaminases were slightly elevated on admission.  Sodium was also low.  Repeat sodium was 140.  Repeat transaminases revealed AST revealed to normal at 19, ALT just barely above normal at 47 with Alk phos still elevated at 333.  Total bilirubin was back to normal at 0.6. Dictated by:   Hal T. Stoneking, M.D. Attending Physician:  Ginette Otto DD:  08/04/01 TD:  08/06/01 Job: [id number]

## 2010-08-16 NOTE — H&P (Signed)
[place]  Patient:    MARTINO, TOMPSON Visit Number: [id number] MRN: [id number]          Service Type: MED Location: 2000 2013 01 Attending Physician:  Georgann Housekeeper Dictated by:   Tyson Dense, M.D. Admit Date:  08/01/2001                           History and Physical  DATE OF BIRTH:  1940-08-26; Mr. Decaire is 70 years old.  CHIEF COMPLAINT:  Fever and weakness.  HISTORY OF PRESENT ILLNESS:  The patient is 70 years old, was initially treated about four weeks ago for fever and bilateral lower lobe consolidations with Cipro and then subsequently with Tequin by Dr. Fransisco Hertz and he felt better.  He also at that time saw Dr. Genene Churn. Love, who is his friend, and was sent to Dr. Darlina Sicilian.  He had bilateral effusions at that time which, after finishing the Tequin course, he was feeling better until a week ago.  He started having a little fatigue and cough and a little low-grade temperature. On Friday, he was seen in the clinic, started on Tequin back again and at that time, a chest CT was obtained which showed bilateral effusions, left greater than right, and mild-to-moderate pericardial effusion with bilateral lower lobe consolidations, no evidence of any mass or adenopathy.  Over the weekend, he had a fever of 102 last night, had started feeling bad, poor p.o. intake. He denies any clear shortness of breath or chest pain.  No pleuritic pain.  No nausea or vomiting.  He had not been eating too well.  He also had started on ibuprofen 600 mg t.i.d. on Friday for questionable post-inflammatory changes and that upset his stomach.  PAST MEDICAL HISTORY: 1. New-onset type 2 diabetes, has been on Actos about three weeks now. 2. Spontaneous pneumothorax in 1963 secondary to a bleb.  PAST SURGICAL HISTORY: 1. Status post tonsillectomy at the age of 17. 2. Status post appendectomy. 3. Status post benign nevus removed.  INJURIES:   Fracture of the left wrist at age 47.  FAMILY HISTORY:  Father died at 104 with Alzheimers disease.  Mother died of breast cancer at 91, also was diabetic.  Four children, healthy.  He is a retired Midwife.   MEDICATIONS: 1. Tequin 400 mg q.d. 2. Actos 15 mg q.d. 3. Advil 600 mg t.i.d.  ALLERGIES:  TETRACYCLINE causes a headache.  REVIEW OF SYSTEMS:  Denies any diarrhea or vomiting.  No headaches or dizziness.  No rash.  No joint complaints.  PHYSICAL EXAMINATION:  VITAL SIGNS:  Temperature 97, blood pressure 140/80, pulse 100, respirations 18, saturations of 97% on room air.  GENERAL:  A well-appearing male in no acute distress.  HEENT/NECK:  Pupils are equal and reactive.  Extraocular movements are intact. Sclerae nonicteric.  No JVD.  Neck was supple.  Oropharynx is clear.  LUNGS:  Decreased breath sounds at both bases, left greater than right, with some crackles.  CARDIAC:  Regular S1 and S2, without any murmurs.  No S3 gallop.  ABDOMEN:  Soft without any tenderness.  EXTREMITIES:  There was no edema, no rashes.  NEUROLOGIC:  Exam nonfocal.  LABORATORY AND ACCESSORY DATA:  CMET, CBC, ANA, TSH and sed rate and CRP were sent.  Chest x-ray and blood cultures were obtained and EKG ordered.  ASSESSMENT AND PLAN:  [age]-year-old with a history of  fevers, bilateral effusions and consolidations and not improving, hemodynamically stable.  1. Bilateral effusions and pneumonia, unclear etiology:  We will get blood    cultures, ultrasound-guided thoracentesis, hold the antibiotics, infectious    disease consult.  I have discussed with Dr. Rockey Situ. Flavia Shipper.,    infectious disease, as far as if he maybe needs to get bronchoscopy.  Will    hold the pulmonary at this point.  2. Pericardial effusion:  Check an echocardiogram, electrocardiogram.  We will    get an ANA, thyroid-stimulating hormone and sedimentation rate and    telemetry. 3. Diabetes:  Insulin,  sliding scale, and Actos. Dictated by:   Tyson Dense, M.D. Attending Physician:  Georgann Housekeeper DD:  08/01/01 TD:  08/02/01 Job: [id number]

## 2011-04-08 IMAGING — CR DG CHEST 1V PORT
1 series · 1 of 1 positions shown · non-contrast
Comparison: 07/27/2008.

CLINICAL DATA: Status post CABG.

PORTABLE CHEST - 1 VIEW

[view not recorded]
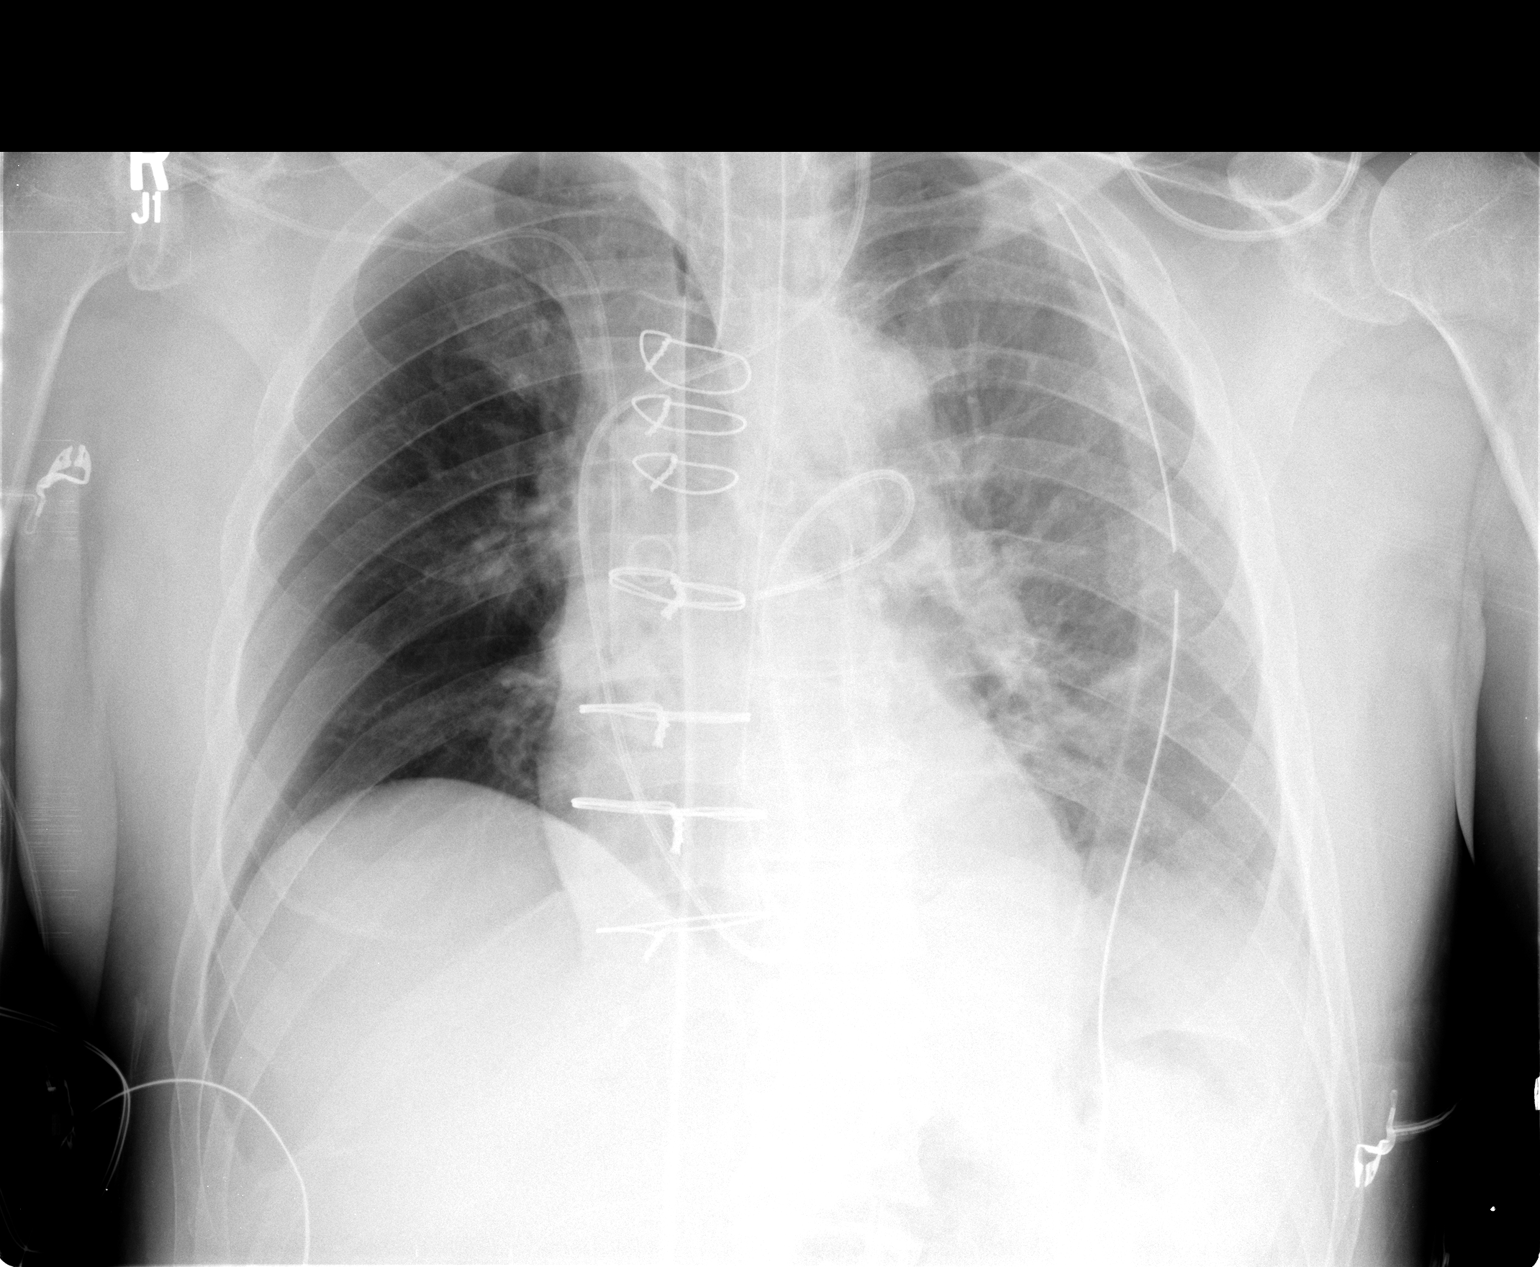

[1 of 1 positions shown; findings below may reference images not displayed]

FINDINGS: Patient is status post coronary bypass graft via median
sternotomy.  Endotracheal tube 3.7 cm above the carina.  Swan-Ganz
catheter tip coiled in the left main pulmonary artery and should be
repositioned.    Unchanged right subclavian line placement.  Left
chest tube in good position with moderate veiling opacity left
chest versus right suggesting fluid.  Mediastinal tube lies in a
right paramedian location with its tip at the top of the sternum.
There may be a small medial pneumothorax/pneumomediastinum
(arrows).
IMPRESSION: Left Swan-Ganz catheter tip coiled in the left main pulmonary
artery.  Reposition.

Possible small right medial pneumothorax/pneumomediastinum.

## 2011-04-10 IMAGING — CR DG CHEST 1V PORT
1 series · 1 of 1 positions shown · non-contrast
Comparison: the previous day's study

CLINICAL DATA: CHF, postop

PORTABLE CHEST - 1 VIEW

[view not recorded]
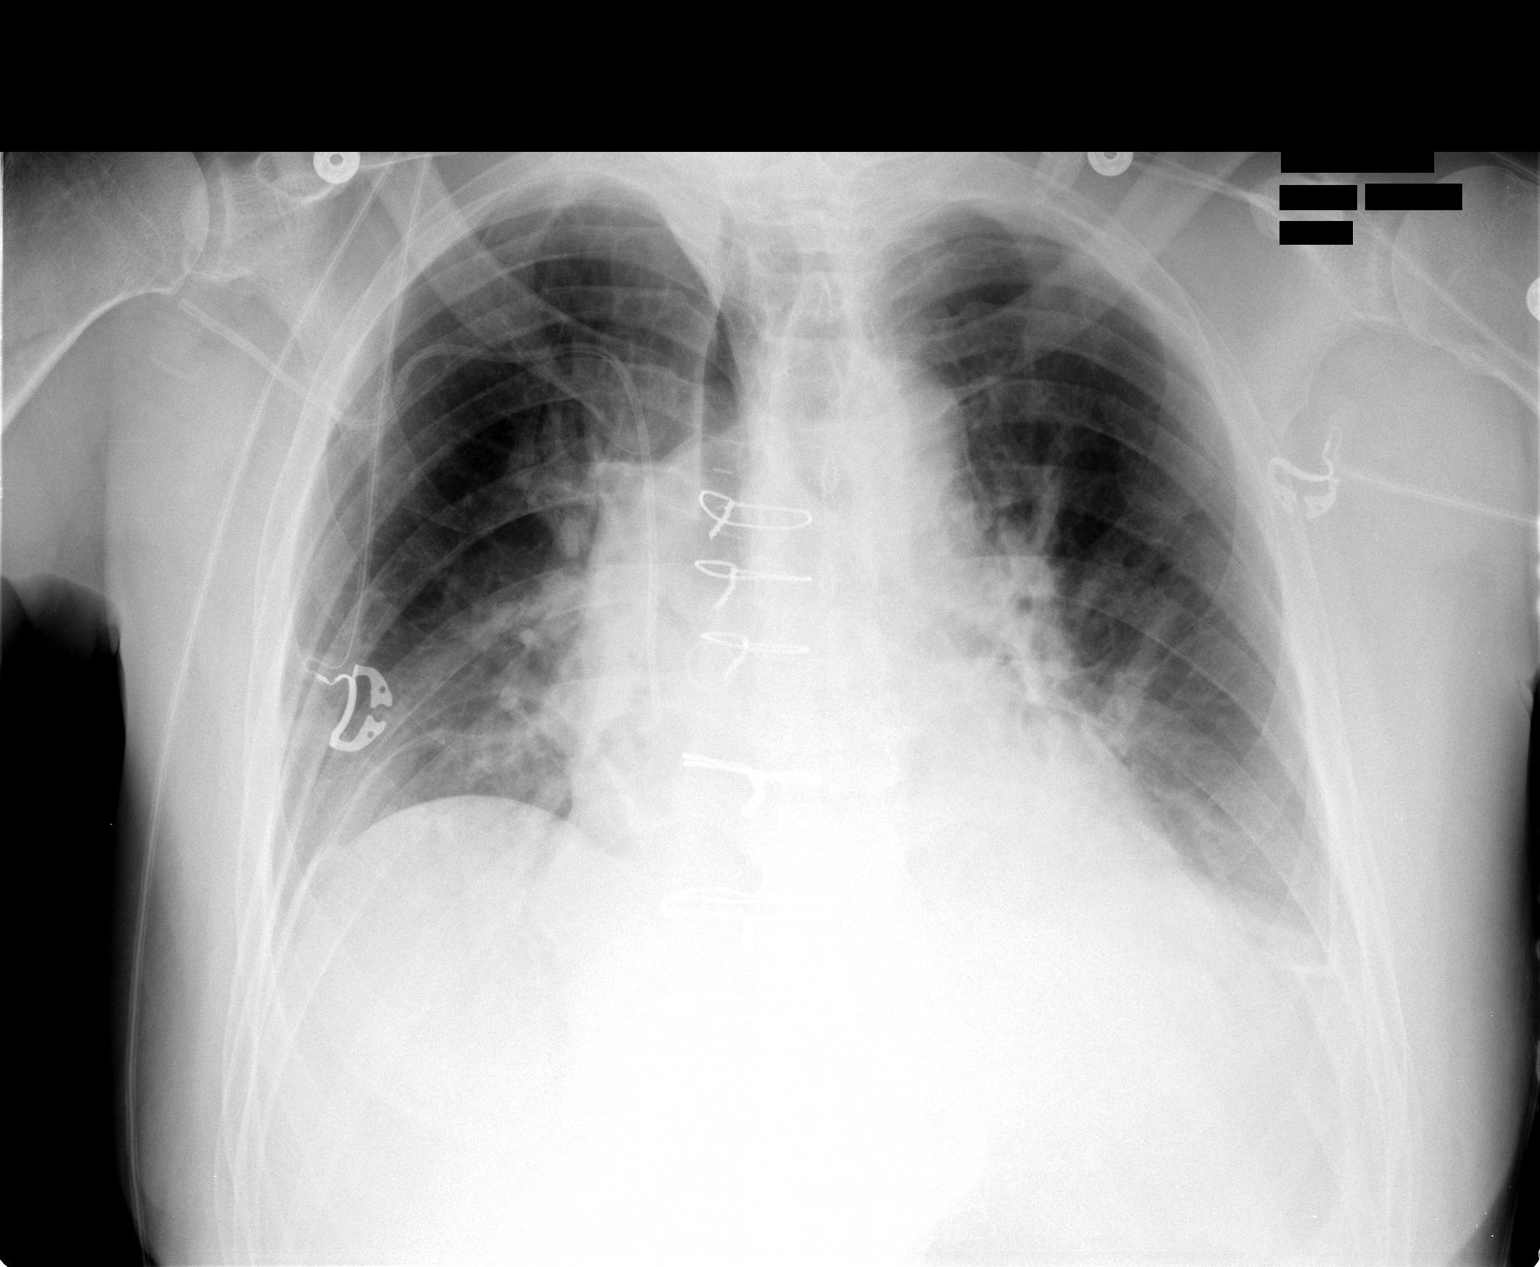

[1 of 1 positions shown; findings below may reference images not displayed]

FINDINGS: The left chest tube has been removed with no pneumothorax
evident.  The left IJ Brack Tiger has been retracted.  The
mediastinal drain has been removed.  Changes of CABG are noted.
Right subclavian central line remains in place.  Low lung volumes
with patchy infrahilar and bibasilar atelectasis or infiltrates as
before.  No definite effusion.  Heart size upper limits normal for
technique.
IMPRESSION: 1.Left chest tube removal with no pneumothorax.
2.  Persistent perihilar and bibasilar atelectasis or infiltrates.

## 2011-05-07 IMAGING — CR DG CHEST 1V
1 series · 1 of 1 positions shown · non-contrast
Comparison: Same day

CLINICAL DATA: Follow-up thoracentesis

CHEST - 1 VIEW

[w chest pa]
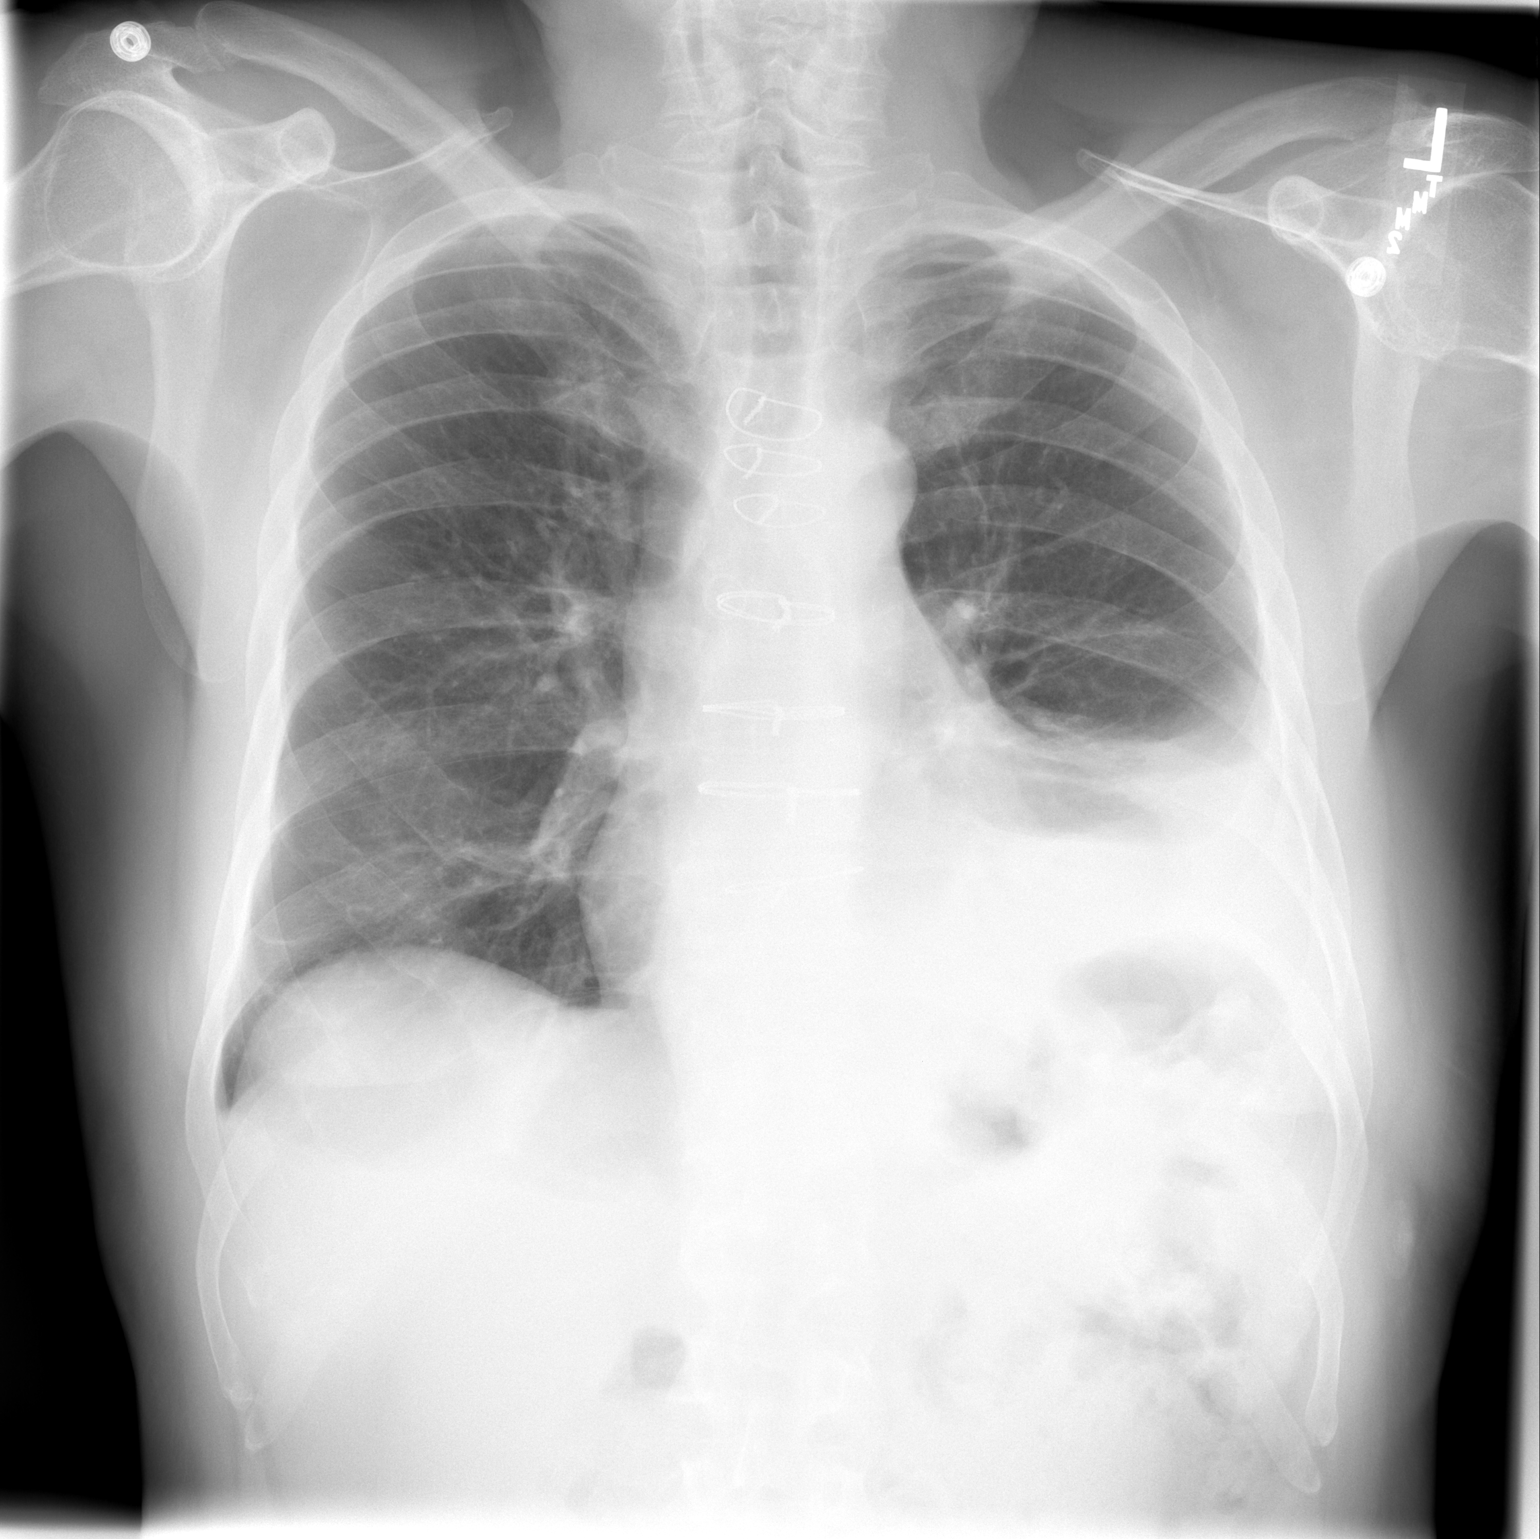

[1 of 1 positions shown; findings below may reference images not displayed]

FINDINGS: Right chest remains clear.  No pneumothorax on the left.
Small amount of pleural fluid does persist on the left with some
elevation of the left hemidiaphragm.
IMPRESSION: No pneumothorax post left thoracentesis.  Some residual pleural
fluid.

## 2011-05-07 IMAGING — CR DG CHEST 2V
2 series · 2 of 2 positions shown · non-contrast
Comparison: 08/15/2008.

CLINICAL DATA: Short of breath and left chest pain.  History of
recent CABG.

CHEST - 2 VIEW

[w chest pa]
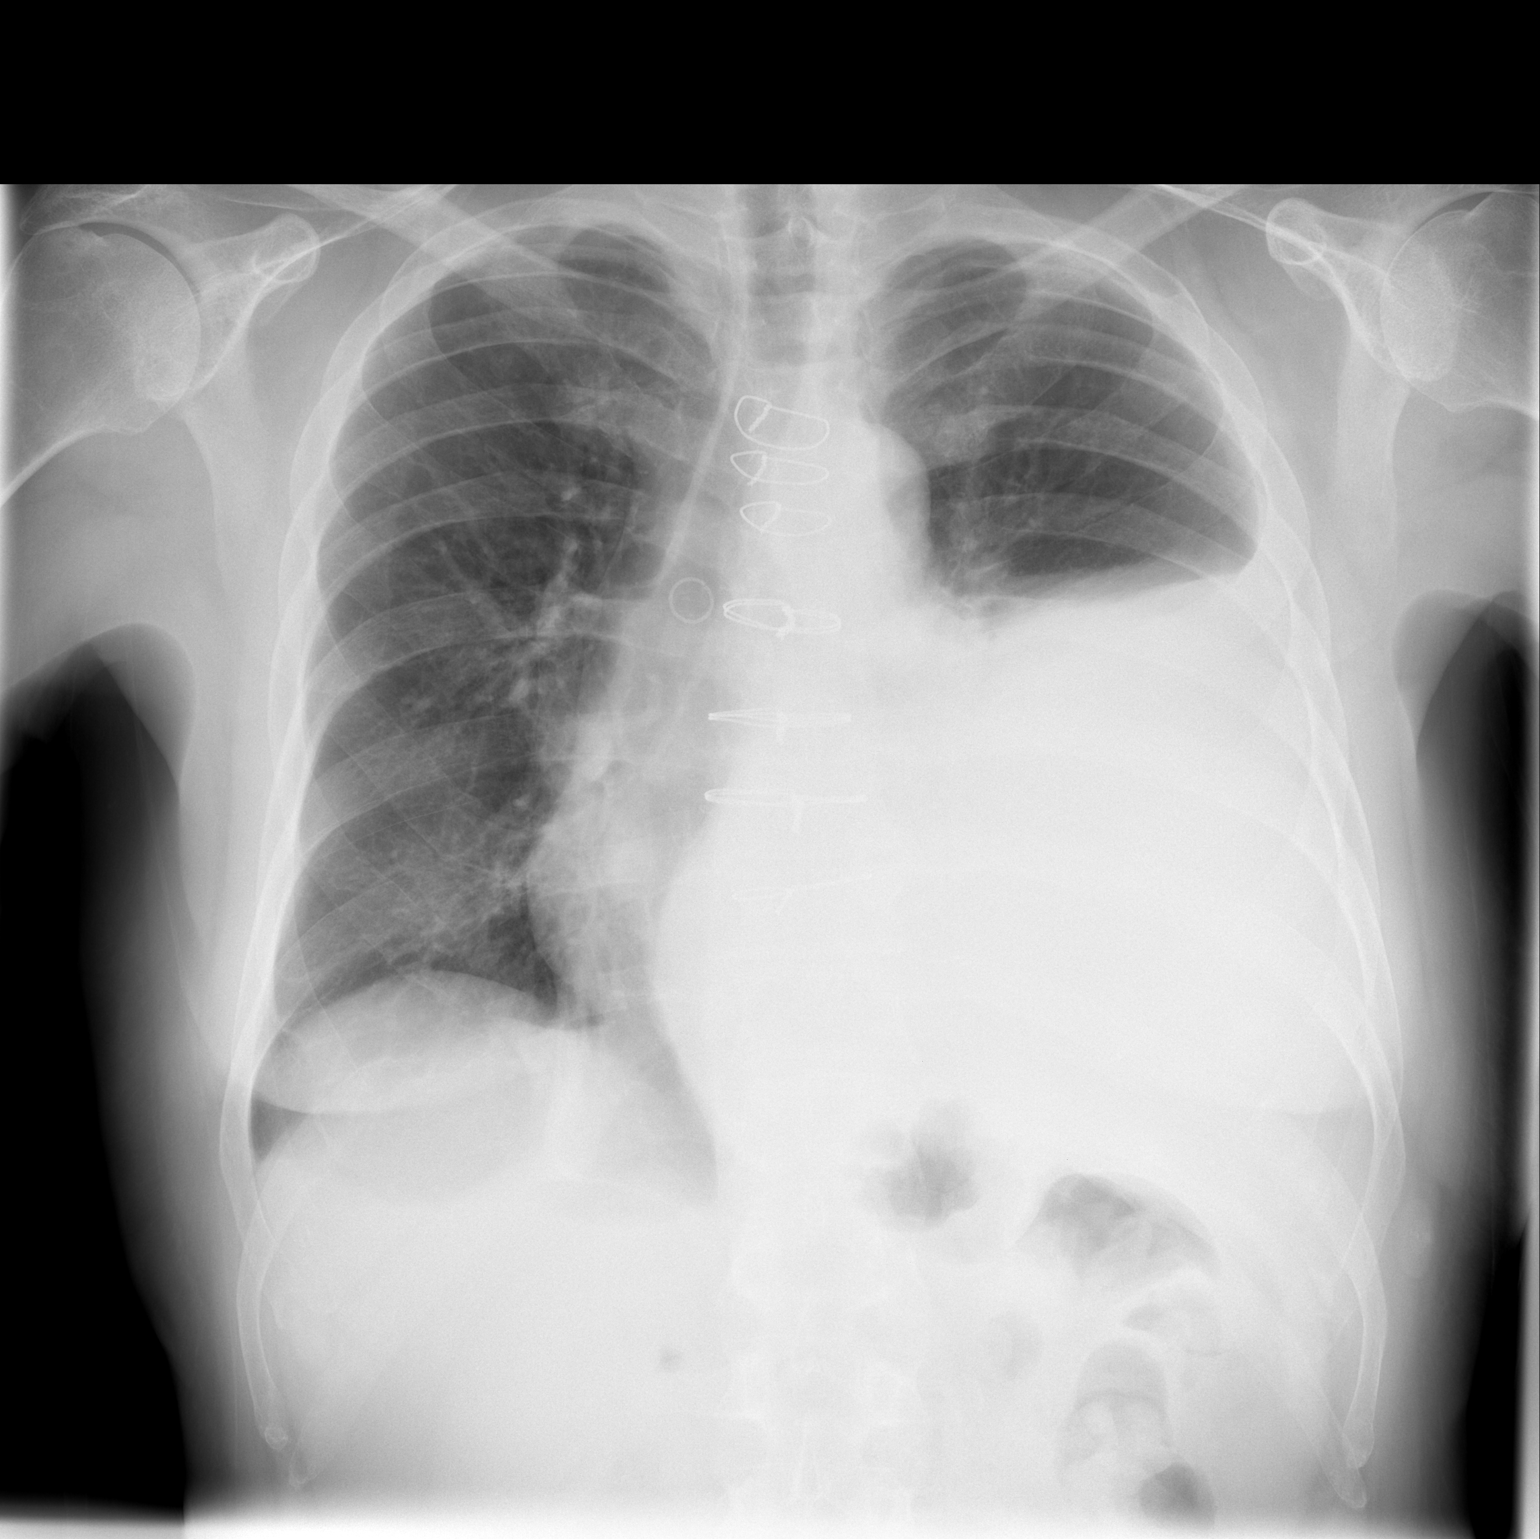

[w chest lat]
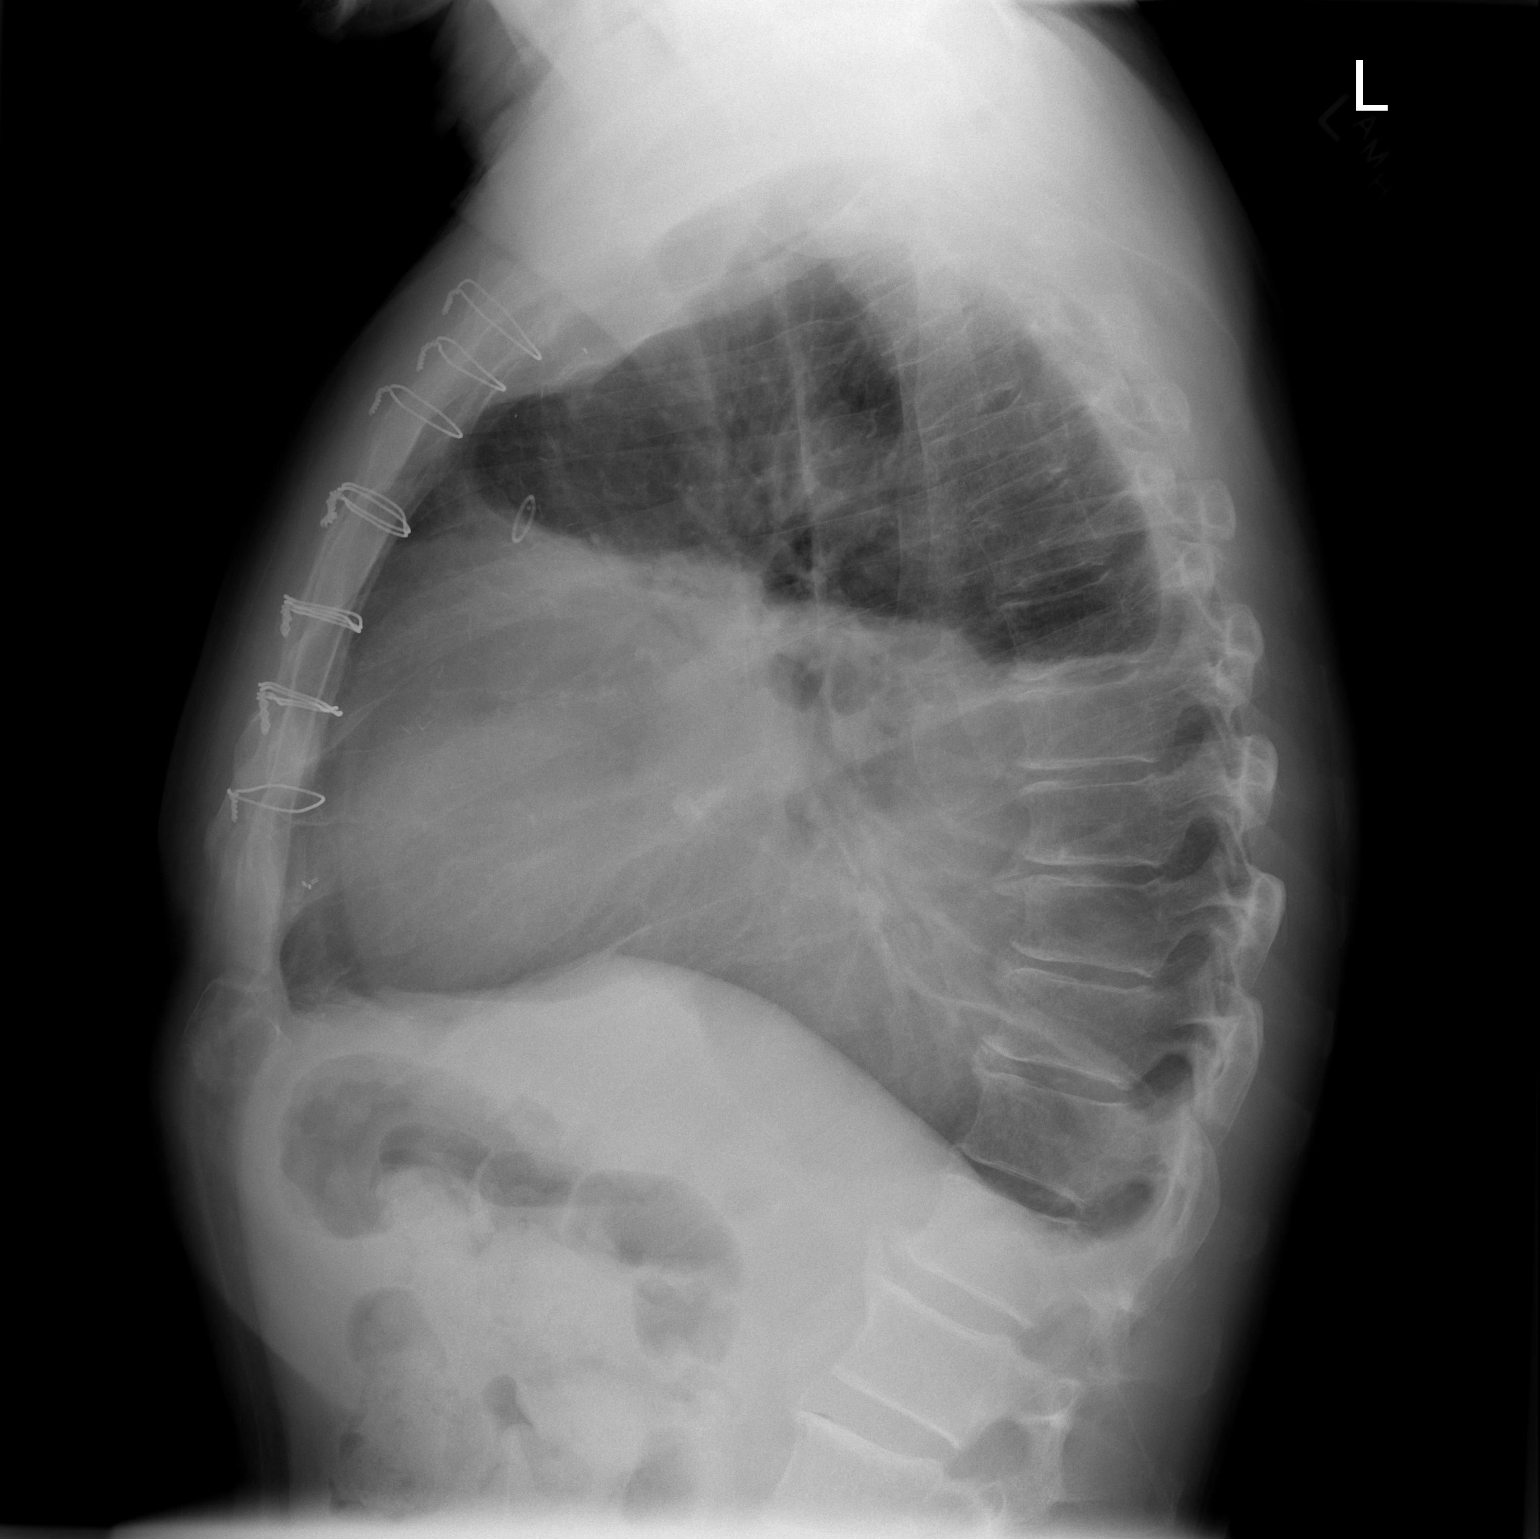

[2 of 2 positions shown; findings below may reference images not displayed]

FINDINGS: Interval increase in  left effusion.  The left effusion
is now large and  occupies approximately two  thirds of the left
chest.  Heart is shifted to the right and there is collapse of the
left lower lobe.

The right lung is clear and  there is no heart failure.  Postop
CABG.
IMPRESSION: Large left pleural effusion which has increased significantly since
the prior study.  There is collapse of the left lower lobe.

## 2013-02-09 ENCOUNTER — Encounter: Payer: Self-pay | Admitting: Interventional Cardiology

## 2013-02-09 ENCOUNTER — Encounter: Payer: Self-pay | Admitting: *Deleted

## 2013-02-09 DIAGNOSIS — E1159 Type 2 diabetes mellitus with other circulatory complications: Secondary | ICD-10-CM | POA: Insufficient documentation

## 2013-02-09 DIAGNOSIS — I25709 Atherosclerosis of coronary artery bypass graft(s), unspecified, with unspecified angina pectoris: Secondary | ICD-10-CM | POA: Insufficient documentation

## 2013-02-09 DIAGNOSIS — I2510 Atherosclerotic heart disease of native coronary artery without angina pectoris: Secondary | ICD-10-CM | POA: Insufficient documentation

## 2013-02-09 DIAGNOSIS — E119 Type 2 diabetes mellitus without complications: Secondary | ICD-10-CM | POA: Insufficient documentation

## 2013-02-09 DIAGNOSIS — E7800 Pure hypercholesterolemia, unspecified: Secondary | ICD-10-CM | POA: Insufficient documentation

## 2013-02-10 ENCOUNTER — Ambulatory Visit (INDEPENDENT_AMBULATORY_CARE_PROVIDER_SITE_OTHER): Payer: Medicare Other | Admitting: Interventional Cardiology

## 2013-02-10 ENCOUNTER — Encounter: Payer: Self-pay | Admitting: Interventional Cardiology

## 2013-02-10 VITALS — BP 132/80 | HR 56 | Ht 67.0 in | Wt 188.4 lb

## 2013-02-10 DIAGNOSIS — E119 Type 2 diabetes mellitus without complications: Secondary | ICD-10-CM

## 2013-02-10 DIAGNOSIS — I2510 Atherosclerotic heart disease of native coronary artery without angina pectoris: Secondary | ICD-10-CM

## 2013-02-10 DIAGNOSIS — E7800 Pure hypercholesterolemia, unspecified: Secondary | ICD-10-CM

## 2013-02-10 NOTE — Patient Instructions (Signed)
Your physician recommends that you continue on your current medications as directed. Please refer to the Current Medication list given to you  Today.  Your physician wants you to follow-up in: 1 year with Dr. Marlou Starks will receive a reminder letter in the mail two months in advance. If you don't receive a letter, please call our office to schedule the follow-up appointment.

## 2013-02-10 NOTE — Progress Notes (Signed)
Patient ID: [name], male   DOB: [birth year], 72 y.o.   MRN: [id number]    [address] [place], [place]  [address] Phone: [phone] Fax:  [phone]  Date:  02/10/2013   ID:  [name], DOB May 28, 1940, MRN [id number]  PCP:  Sherryl Manges, MD   ASSESSMENT:  1. CAD, status post CABG, asymptomatic 2. Hyperlipidemia, on therapy and at target 3. Diabetes mellitus, controlled with exercise, diet and medications.  PLAN:  1. Continue active lifestyle. 2. Monitor caloric intake. 3. Clinical followup in one year  SUBJECTIVE: [name] is a 72 y.o. male who has no cardiac complaints. He continues to exercise regularly and has noted no change in exertional tolerance. He has not had neurological complaints. No medication side effects have been noted and he denies dyspnea, palpitations, edema, and syncope.   Wt Readings from Last 3 Encounters:  02/10/13 188 lb 6.4 oz (85.458 kg)     Past Medical History  Diagnosis Date  . Hypercholesteremia   . Coronary atherosclerosis of unspecified type of vessel, native or graft   . Type II or unspecified type diabetes mellitus without mention of complication, not stated as uncontrolled   . CAD (coronary artery disease)   . Diabetes     Current Outpatient Prescriptions  Medication Sig Dispense Refill  . alfuzosin (UROXATRAL) 10 MG 24 hr tablet Take 1 tablet by mouth daily.      Marland Kitchen aspirin 325 MG EC tablet Take 325 mg by mouth daily.      Marland Kitchen ezetimibe-simvastatin (VYTORIN) 10-40 MG per tablet Take 1 tablet by mouth daily.      . metoprolol tartrate (LOPRESSOR) 25 MG tablet Take 1/2 tablet twice a day      . Multiple Vitamin (MULTIVITAMIN) capsule Take 1 capsule by mouth daily.       No current facility-administered medications for this visit.    Allergies:    Allergies  Allergen Reactions  . Tetracyclines & Related     Social History:  The patient  reports that he has never smoked. He does not  have any smokeless tobacco history on file. He reports that he drinks about 4.4 ounces of alcohol per week. He reports that he does not use illicit drugs.   ROS:  Please see the history of present illness.   All other systems reviewed and negative.   OBJECTIVE: VS:  BP 132/80  Pulse 56  Ht 5\' 7"  (1.702 m)  Wt 188 lb 6.4 oz (85.458 kg)  BMI 29.50 kg/m2 Well nourished, well developed, in no acute distress, mildly obese HEENT: normal Neck: JVD flat. Carotid bruit absent  Cardiac:  normal S1, S2; RRR; no murmur Lungs:  clear to auscultation bilaterally, no wheezing, rhonchi or rales Abd: soft, nontender, no hepatomegaly Ext: Edema absent. Pulses 2+ Skin: warm and dry Neuro:  CNs 2-12 intact, no focal abnormalities noted  EKG:   Sinus bradycardia with poor R-wave progression V1 through V3. Unchanged from prior tracings.       Signed, Darci Needle III, MD 02/10/2013 10:40 AM

## 2013-03-16 ENCOUNTER — Other Ambulatory Visit: Payer: Self-pay | Admitting: Interventional Cardiology

## 2013-04-22 ENCOUNTER — Other Ambulatory Visit: Payer: Self-pay

## 2013-04-25 ENCOUNTER — Other Ambulatory Visit: Payer: Self-pay

## 2013-04-25 MED ORDER — METOPROLOL SUCCINATE ER 25 MG PO TB24: 25.0000 mg | ORAL_TABLET | Freq: Every day | ORAL | Status: DC

## 2014-02-13 ENCOUNTER — Encounter: Payer: Self-pay | Admitting: Interventional Cardiology

## 2014-02-13 ENCOUNTER — Ambulatory Visit (INDEPENDENT_AMBULATORY_CARE_PROVIDER_SITE_OTHER): Payer: Medicare Other | Admitting: Interventional Cardiology

## 2014-02-13 VITALS — BP 110/60 | Ht 67.0 in | Wt 186.0 lb

## 2014-02-13 DIAGNOSIS — I10 Essential (primary) hypertension: Secondary | ICD-10-CM

## 2014-02-13 DIAGNOSIS — E7800 Pure hypercholesterolemia, unspecified: Secondary | ICD-10-CM

## 2014-02-13 DIAGNOSIS — I25810 Atherosclerosis of coronary artery bypass graft(s) without angina pectoris: Secondary | ICD-10-CM

## 2014-02-13 NOTE — Patient Instructions (Signed)
Your physician recommends that you continue on your current medications as directed. Please refer to the Current Medication list given to you today.  Your physician wants you to follow-up in: 1 year. You will receive a reminder letter in the mail two months in advance. If you don't receive a letter, please call our office to schedule the follow-up appointment.  

## 2014-02-13 NOTE — Progress Notes (Signed)
Patient ID: WINFIELD CABA, male   DOB: Aug 14, 1940, 73 y.o.   MRN: [id number]    [address] [place], [place]  [address] Phone: [phone] Fax:  [phone]  Date:  02/13/2014   ID:  BRIANT ANGELILLO, DOB Jan 31, 1941, MRN [id number]  PCP:  Mathews Argyle, MD   ASSESSMENT:  1. Coronary artery disease with prior coronary bypass grafting in no symptoms to suggest angina/ischemia 2. Hypertension, controlled without symptoms or medication side effects 3. Hyperlipidemia followed by primary care physician with most recent LDL less than 80  PLAN:  1. Continue aerobic activity. Report any sudden decrement in physical activity/tolerance and symptoms. 2. Clinical follow-up in one year 3. No change in current medical regimen   SUBJECTIVE: [name] is a 73 y.o. male doing well. He complains of recent skin cancer surgery in the occipital area that required a wide resection of melanoma and subsequent treatment with a skin flap. He denies orthopnea, PND, transient neurological complaints, palpitations, and edema. He fractured his left lower extremity while playing golf. This is healed and he is back on elliptical exercise.   Wt Readings from Last 3 Encounters:  02/13/14 186 lb (84.369 kg)  02/10/13 188 lb 6.4 oz (85.458 kg)     Past Medical History  Diagnosis Date  . Hypercholesteremia   . Coronary atherosclerosis of unspecified type of vessel, native or graft   . Type II or unspecified type diabetes mellitus without mention of complication, not stated as uncontrolled   . CAD (coronary artery disease)   . Diabetes     Current Outpatient Prescriptions  Medication Sig Dispense Refill  . alfuzosin (UROXATRAL) 10 MG 24 hr tablet Take 1 tablet by mouth daily.    Marland Kitchen aspirin 325 MG EC tablet Take 325 mg by mouth daily.    Marland Kitchen ezetimibe-simvastatin (VYTORIN) 10-40 MG per tablet Take 1 tablet by mouth daily.    . metFORMIN (GLUCOPHAGE-XR) 500 MG 24 hr tablet  Take 500 mg by mouth daily with breakfast.     . metoprolol succinate (TOPROL XL) 25 MG 24 hr tablet Take 1 tablet (25 mg total) by mouth daily. 30 tablet 10  . Multiple Vitamin (MULTIVITAMIN) capsule Take 1 capsule by mouth daily.     No current facility-administered medications for this visit.    Allergies:    Allergies  Allergen Reactions  . Tetracyclines & Related     Social History:  The patient  reports that he has never smoked. He does not have any smokeless tobacco history on file. He reports that he drinks about 4.4 oz of alcohol per week. He reports that he does not use illicit drugs.   ROS:  Please see the history of present illness.   Denies transient neurological symptoms. No abdominal discomfort. No melena or vomiting. No medication side effects.   All other systems reviewed and negative.   OBJECTIVE: VS:  BP 110/60 mmHg  Ht 5\' 7"  (1.702 m)  Wt 186 lb (84.369 kg)  BMI 29.12 kg/m2 Well nourished, well developed, in no acute distress, compatible with age 57: normal Neck: JVD flat. Carotid bruit absent  Cardiac:  normal S1, S2; RRR; no murmur Lungs:  clear to auscultation bilaterally, no wheezing, rhonchi or rales Abd: soft, nontender, no hepatomegaly Ext: Edema none. Pulses 2+ and symmetric Skin: warm and dry Neuro:  CNs 2-12 intact, no focal abnormalities noted  EKG:  Normal sinus rhythm with poor R-wave progression and first-degree AV block  no change compared to prior tracings.       Signed, Illene Labrador III, MD 02/13/2014 3:19 PM

## 2014-03-26 ENCOUNTER — Other Ambulatory Visit: Payer: Self-pay | Admitting: Interventional Cardiology

## 2014-03-27 ENCOUNTER — Other Ambulatory Visit: Payer: Self-pay

## 2014-03-27 MED ORDER — METOPROLOL SUCCINATE ER 25 MG PO TB24: 25.0000 mg | ORAL_TABLET | Freq: Every day | ORAL | Status: DC

## 2014-03-27 NOTE — Telephone Encounter (Signed)
Pt called in to rqst Metoprolol refill. 90day R-3 sent to [facility]

## 2014-05-15 ENCOUNTER — Other Ambulatory Visit: Payer: Self-pay | Admitting: Dermatology

## 2015-03-20 ENCOUNTER — Ambulatory Visit (INDEPENDENT_AMBULATORY_CARE_PROVIDER_SITE_OTHER): Payer: Medicare Other | Admitting: Interventional Cardiology

## 2015-03-20 ENCOUNTER — Encounter: Payer: Self-pay | Admitting: Interventional Cardiology

## 2015-03-20 VITALS — BP 108/72 | HR 60 | Ht 67.0 in | Wt 193.8 lb

## 2015-03-20 DIAGNOSIS — E7800 Pure hypercholesterolemia, unspecified: Secondary | ICD-10-CM

## 2015-03-20 DIAGNOSIS — I25810 Atherosclerosis of coronary artery bypass graft(s) without angina pectoris: Secondary | ICD-10-CM

## 2015-03-20 DIAGNOSIS — I10 Essential (primary) hypertension: Secondary | ICD-10-CM

## 2015-03-20 NOTE — Patient Instructions (Signed)

## 2015-03-20 NOTE — Progress Notes (Signed)
Cardiology Office Note   Date:  03/20/2015   ID:  [name], DOB Apr 27, 1940, MRN [id number]  PCP:  Mathews Argyle, MD  Cardiologist:  [name], MD   Chief Complaint  Patient presents with  . Coronary Artery Disease      History of Present Illness: [name] is a 74 y.o. male who presents for CAD, status post CABG with LIMA to LAD, history of out-of-hospital ventricular fibrillation cardiac arrest with successful resuscitation, essential hypertension, and diabetes mellitus.  He remains very active. He denies chest discomfort. He denies dyspnea. He exercises daily. He has no artery or pulmonary complaints. There is no claudication. No neurological complaints.    Past Medical History  Diagnosis Date  . Hypercholesteremia   . Coronary atherosclerosis of unspecified type of vessel, native or graft   . Type II or unspecified type diabetes mellitus without mention of complication, not stated as uncontrolled   . CAD (coronary artery disease)   . Diabetes     Past Surgical History  Procedure Laterality Date  . Tonsillectomy    . Appendectomy    . Benign pigmented nevus    . Fractured left wrist    . Cabg dr. Servando Snare  May 2010     Current Outpatient Prescriptions  Medication Sig Dispense Refill  . alfuzosin (UROXATRAL) 10 MG 24 hr tablet Take 1 tablet by mouth daily.    Marland Kitchen aspirin 325 MG EC tablet Take 325 mg by mouth daily.    Marland Kitchen ezetimibe-simvastatin (VYTORIN) 10-40 MG per tablet Take 1 tablet by mouth daily.    . metFORMIN (GLUCOPHAGE-XR) 500 MG 24 hr tablet Take 500 mg by mouth daily with breakfast.     . metoprolol succinate (TOPROL XL) 25 MG 24 hr tablet Take 1 tablet (25 mg total) by mouth daily. 90 tablet 3  . Multiple Vitamin (MULTIVITAMIN) capsule Take 1 capsule by mouth daily.     No current facility-administered medications for this visit.    Allergies:   Tetracyclines & related    Social History:  The patient  reports  that he has never smoked. He does not have any smokeless tobacco history on file. He reports that he drinks about 4.4 oz of alcohol per week. He reports that he does not use illicit drugs.   Family History:  The patient's family history includes Alzheimer's disease in his father; Breast cancer in his mother; CAD in his father; Diabetes in his mother.    ROS:  Please see the history of present illness.   Otherwise, review of systems are positive for none.   All other systems are reviewed and negative.    PHYSICAL EXAM: VS:  There were no vitals taken for this visit. , BMI There is no weight on file to calculate BMI. GEN: Well nourished, well developed, in no acute distress HEENT: normal Neck: no JVD, carotid bruits, or masses Cardiac: RRR.  There is no murmur, rub, or gallop. There is no edema. Respiratory:  clear to auscultation bilaterally, normal work of breathing. GI: soft, nontender, nondistended, + BS MS: no deformity or atrophy Skin: warm and dry, no rash Neuro:  Strength and sensation are intact Psych: euthymic mood, full affect   EKG:  EKG is ordered today. The ekg reveals normal sinus rhythm with first-degree AV block and otherwise normal.   Recent Labs: No results found for requested labs within last 365 days.    Lipid Panel No results found for: CHOL, TRIG,  HDL, CHOLHDL, VLDL, LDLCALC, LDLDIRECT    Wt Readings from Last 3 Encounters:  02/13/14 186 lb (84.369 kg)  02/10/13 188 lb 6.4 oz (85.458 kg)      Other studies Reviewed: Additional studies/ records that were reviewed today include: NoneThe findings include none.    ASSESSMENT AND PLAN:  1. Coronary artery disease involving autologous artery coronary bypass graft without angina pectoris Asymptomatic. He has limited LAD.  2. Essential hypertension Excellent control.  3. Hypercholesteremia Followed by primary care physician, Dr. Felipa Eth  4. 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  The patient has the following concerns regarding medicines: None .  The following changes/actions have been instituted:    NONE   Labs/ tests ordered today include:  No orders of the defined types were placed in this encounter.     Disposition:   FU with HS in 1 year  Signed, [name], MD  03/20/2015 8:29 AM    [facility] [place], [place], [place]  [address] Phone: [phone]; Fax: [phone]

## 2015-03-26 ENCOUNTER — Other Ambulatory Visit: Payer: Self-pay | Admitting: Interventional Cardiology

## 2016-03-13 ENCOUNTER — Other Ambulatory Visit: Payer: Self-pay | Admitting: Interventional Cardiology

## 2016-04-24 DIAGNOSIS — I10 Essential (primary) hypertension: Secondary | ICD-10-CM | POA: Insufficient documentation

## 2016-04-25 ENCOUNTER — Ambulatory Visit (INDEPENDENT_AMBULATORY_CARE_PROVIDER_SITE_OTHER): Payer: Medicare Other | Admitting: Interventional Cardiology

## 2016-04-25 ENCOUNTER — Encounter: Payer: Self-pay | Admitting: Interventional Cardiology

## 2016-04-25 VITALS — BP 122/80 | HR 46 | Ht 67.0 in | Wt 195.8 lb

## 2016-04-25 DIAGNOSIS — I2510 Atherosclerotic heart disease of native coronary artery without angina pectoris: Secondary | ICD-10-CM | POA: Diagnosis not present

## 2016-04-25 DIAGNOSIS — I10 Essential (primary) hypertension: Secondary | ICD-10-CM | POA: Diagnosis not present

## 2016-04-25 DIAGNOSIS — E7800 Pure hypercholesterolemia, unspecified: Secondary | ICD-10-CM | POA: Diagnosis not present

## 2016-04-25 DIAGNOSIS — R001 Bradycardia, unspecified: Secondary | ICD-10-CM

## 2016-04-25 DIAGNOSIS — E1159 Type 2 diabetes mellitus with other circulatory complications: Secondary | ICD-10-CM

## 2016-04-25 NOTE — Patient Instructions (Signed)

## 2016-04-25 NOTE — Progress Notes (Signed)
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  [name] continues to exercise regularly. He has no cardiac complaints. No episodes of syncope. He monitors his heart rate with exercise regularly. He routinely gets his heart rate up around 110 bpm for a period of time during exercise.Marland Kitchen Activity does not cause chest discomfort.   He denies orthopnea, PND, lower extremity swelling. No medication side effects.  Past Medical History:  Diagnosis Date  . CAD (coronary artery disease)   . Coronary atherosclerosis of unspecified type of vessel, native or graft   . Diabetes ([place])   . Hypercholesteremia   . Type II or unspecified type diabetes mellitus without mention of complication, not stated as uncontrolled     Past Surgical History:  Procedure Laterality Date  . APPENDECTOMY    . benign pigmented nevus    . CABG Dr. Servando Snare  May 2010  . fractured left wrist    . TONSILLECTOMY      Current Medications: Outpatient Medications Prior to Visit  Medication Sig Dispense Refill  . alfuzosin (UROXATRAL) 10 MG 24 hr tablet Take 1 tablet by mouth daily.    Marland Kitchen aspirin 325 MG EC tablet Take 325 mg by mouth daily.    . metFORMIN (GLUCOPHAGE-XR) 500 MG 24 hr tablet Take 500 mg by mouth daily with breakfast.     . metoprolol succinate (TOPROL-XL) 25 MG 24 hr tablet Take 1 tablet (25 mg total) by mouth daily. 90 tablet 0  . Multiple Vitamin (MULTIVITAMIN) capsule Take 1 capsule by mouth daily.    Marland Kitchen VYTORIN 10-80 MG tablet Take  1 tablet by mouth daily.     No facility-administered medications prior to visit.      Allergies:   Tetracyclines & related   Social History   Social History  . Marital status: Married    Spouse name: N/A  . Number of children: N/A  . Years of education: N/A   Social History Main Topics  . Smoking status: Never Smoker  . Smokeless tobacco: Never Used  . Alcohol use 4.8 oz/week    4 Cans of beer, 4 Standard drinks or equivalent per week  . Drug use: No  . Sexual activity: Not Asked   Other Topics Concern  . None   Social History Narrative  . None     Family History:  The patient's family history includes Alzheimer's disease in his father; Breast cancer in his mother; CAD in his father; Diabetes in his mother; Healthy in his sister; Stroke in his father.   ROS:   Please see the history of present illness.    Some difficulty sleeping. Otherwise no complaints.  All other systems reviewed and are negative.   PHYSICAL EXAM:   VS:  BP 122/80 (BP Location: Left Arm)   Pulse (!) 46   Ht 5\' 7"  (1.702 m)   Wt 195 lb 12.8 oz (88.8 kg)   BMI 30.67 kg/m    GEN: Well nourished, well developed, in no acute distress  HEENT:  normal  Neck: no JVD, carotid bruits, or masses Cardiac: RRR; no murmurs, rubs, or gallops,no edema  Respiratory:  clear to auscultation bilaterally, normal work of breathing GI: soft, nontender, nondistended, + BS MS: no deformity or atrophy  Skin: warm and dry, no rash Neuro:  Alert and Oriented x 3, Strength and sensation are intact Psych: euthymic mood, full affect  Wt Readings from Last 3 Encounters:  04/25/16 195 lb 12.8 oz (88.8 kg)  03/20/15 193 lb 12.8 oz (87.9 kg)  02/13/14 186 lb (84.4 kg)      Studies/Labs Reviewed:   EKG:  EKG  Marked sinus bradycardia at 46 bpm. Otherwise no abnormality. Compared to before the heart rate is slightly slower.  Recent Labs: No results found for requested labs within last 8760 hours.   Lipid Panel No  results found for: CHOL, TRIG, HDL, CHOLHDL, VLDL, LDLCALC, LDLDIRECT  Additional studies/ records that were reviewed today include:  No new data    ASSESSMENT:    1. Atherosclerosis of native coronary artery of native heart without angina pectoris   2. DM type 2 causing vascular disease ([place])   3. Hypercholesteremia   4. Essential hypertension      PLAN:  In order of problems listed above:  1. Encouraged continued aerobic activity. No functional testing is recommended at this time. 2. Continued aerobic activity and close monitoring of A1c with target less than 7. 3. LDL target remains 70 last. This is followed by Dr. Felipa Eth. 4. Low-salt diet. Target 140/90 mmHg or less. 5. If develops symptomatic bradycardia, or inability to get heart rate above 100 bpm at exercise, will decrease metoprolol to 12.5 mg per day or discontinue the therapy.    Medication Adjustments/Labs and Tests Ordered: Current medicines are reviewed at length with the patient today.  Concerns regarding medicines are outlined above.  Medication changes, Labs and Tests ordered today are listed in the Patient Instructions below. Patient Instructions  Medication Instructions:  None  Labwork: None  Testing/Procedures: None  Follow-Up: Your physician wants you to follow-up in: 1 year with Dr. Tamala Julian. You will receive a reminder letter in the mail two months in advance. If you don't receive a letter, please call our office to schedule the follow-up appointment.   Any Other Special Instructions Will Be Listed Below (If Applicable).     If you need a refill on your cardiac medications before your next appointment, please call your pharmacy.      Signed, [name], MD  04/25/2016 3:15 PM    [facility] [place], [place], [place]  [address] Phone: [phone]; Fax: [phone]

## 2016-06-15 ENCOUNTER — Other Ambulatory Visit: Payer: Self-pay | Admitting: Interventional Cardiology

## 2017-03-05 ENCOUNTER — Other Ambulatory Visit: Payer: Self-pay | Admitting: Interventional Cardiology

## 2017-04-26 NOTE — Progress Notes (Signed)
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 Coronary Artery Disease  . 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   Doing well.  No episodes of syncope/near syncope.  No claudication or angina.  Exercising regularly.  Denies dyspnea, edema, and orthopnea.  No medication side effects.  All his iPhone notices resting heart rates less than 50.  Heart rates increase to 105 with exercise.  He stays at heart rates less than 110 purposefully.   Past Medical History:  Diagnosis Date  . CAD (coronary artery disease)   . Coronary atherosclerosis of unspecified type of vessel, native or graft   . Diabetes ([name])   . Hypercholesteremia   . Type II or unspecified type diabetes mellitus without mention of complication, not stated as uncontrolled     Past Surgical History:  Procedure Laterality Date  . APPENDECTOMY    . benign pigmented nevus    . CABG Dr. Servando Lara  May 2010  . fractured left wrist    . TONSILLECTOMY      Current Medications: Outpatient Medications Prior to Visit  Medication Sig Dispense Refill  . alfuzosin (UROXATRAL) 10 MG 24 hr tablet Take 1 tablet by mouth daily.    . finasteride (PROSCAR) 5 MG tablet Take 5 mg by mouth daily.    . metFORMIN (GLUCOPHAGE-XR) 500 MG 24 hr tablet Take 500 mg by mouth daily with breakfast.     . Multiple Vitamin (MULTIVITAMIN) capsule Take 1 capsule by mouth daily.    Marland Kitchen VYTORIN 10-80 MG tablet Take 1 tablet by mouth daily.    Marland Kitchen aspirin 325 MG EC tablet Take 325 mg by mouth daily.    . metoprolol succinate  (TOPROL-XL) 25 MG 24 hr tablet Take 1 tablet (25 mg total) by mouth daily. Please keep upcoming appt in January with Dr. Tamala Julian. Thank you 90 tablet 0   No facility-administered medications prior to visit.      Allergies:   Tetracyclines & related and Bactrim [sulfamethoxazole-trimethoprim]   Social History   Socioeconomic History  . Marital status: Married    Spouse name: None  . Number of children: None  . Years of education: None  . Highest education level: None  Social Needs  . Financial resource strain: None  . Food insecurity - worry: None  . Food insecurity - inability: None  . Transportation needs - medical: None  . Transportation needs - non-medical: None  Occupational History  . None  Tobacco Use  . Smoking status: Never Smoker  . Smokeless tobacco: Never Used  Substance and Sexual Activity  . Alcohol use: Yes    Alcohol/week: 4.8 oz    Types: 4 Cans of beer, 4 Standard drinks or equivalent per week  . Drug use: No  . Sexual activity: None  Other Topics Concern  . None  Social History Narrative  . None     Family History:  The patient's family history includes Alzheimer's disease in his father; Breast cancer in his mother; CAD in his father; Diabetes in his mother; Healthy  in his sister; Stroke in his father.   ROS:   Please see the history of present illness.    Hematuria earlier this year.  CT scan did not reveal any evidence of an abdominal aneurysm or other issues. All other systems reviewed and are negative.   PHYSICAL EXAM:   VS:  BP 106/60   Pulse (!) 48   Ht 5\' 7"  (1.702 m)   Wt 190 lb (86.2 kg)   BMI 29.76 kg/m    GEN: Well nourished, well developed, in no acute distress  HEENT: normal  Neck: no JVD, carotid bruits, or masses Cardiac: RRR; no murmurs, rubs, or gallops,no edema  Respiratory:  clear to auscultation bilaterally, normal work of breathing GI: soft, nontender, nondistended, + BS MS: no deformity or atrophy  Skin: warm and dry,  no rash Neuro:  Alert and Oriented x 3, Strength and sensation are intact Psych: euthymic mood, full affect  Wt Readings from Last 3 Encounters:  04/27/17 190 lb (86.2 kg)  04/25/16 195 lb 12.8 oz (88.8 kg)  03/20/15 193 lb 12.8 oz (87.9 kg)      Studies/Labs Reviewed:   EKG:  EKG sinus bradycardia, otherwise normal in appearance.  Recent Labs: No results found for requested labs within last 8760 hours.   Lipid Panel No results found for: CHOL, TRIG, HDL, CHOLHDL, VLDL, LDLCALC, LDLDIRECT  Additional studies/ records that were reviewed today include:  Reportedly, abdominal CT scan be done within the past 12 months revealed no evidence of abdominal aortic aneurysm.  Aortic atherosclerosis was noted.    ASSESSMENT:    1. Coronary artery disease involving coronary bypass graft of native heart with angina pectoris ([place])   2. Essential hypertension   3. Hypercholesteremia   4. Sinus bradycardia   5. DM type 2 causing vascular disease ([name])      PLAN:  In order of problems listed above:  1. Doing well without angina.  Continue aerobic activity. 2. Excellent blood pressure control and actually systolic blood pressure 3. LDL 1 year ago was 68.  This year's laboratory data is pending. 4. Decrease metoprolol to succinate 12.5 mg daily for 1-2 weeks then discontinue.  This will liberalize heart rate and provide further protection against fainting. 5. We discussed hemoglobin A1c target of 7 or less.  Aerobic activity.  Clinical follow-up in 1 year.  Wean and DC beta-blocker therapy.  Target heart rate for exercise 120 bpm.  Call if target heart rate becomes too easy to achieve.  May need to add back l a lower dose of beta-blocker therapy.    Medication Adjustments/Labs and Tests Ordered: Current medicines are reviewed at length with the patient today.  Concerns regarding medicines are outlined above.  Medication changes, Labs and Tests ordered today are listed in the Patient  Instructions below. Patient Instructions  Medication Instructions:  1) DECREASE Aspirin to 81mg  once daily 2) DECREASE Metoprolol to 12.5mg  (half tablet) once daily for 7-10 days and then discontinue.   Labwork: None  Testing/Procedures: None  Follow-Up: Your physician wants you to follow-up in: 1 year with Dr. Tamala Julian. You will receive a reminder letter in the mail two months in advance. If you don't receive a letter, please call our office to schedule the follow-up appointment.   Any Other Special Instructions Will Be Listed Below (If Applicable).     If you need a refill on your cardiac medications before your next appointment, please call your pharmacy.      Signed,  Jonathan Grooms, MD  04/27/2017 8:42 AM    [facility], [place], [place]  [address] Phone: [phone]; Fax: [phone]

## 2017-04-27 ENCOUNTER — Encounter (INDEPENDENT_AMBULATORY_CARE_PROVIDER_SITE_OTHER): Payer: Self-pay

## 2017-04-27 ENCOUNTER — Encounter: Payer: Self-pay | Admitting: Interventional Cardiology

## 2017-04-27 ENCOUNTER — Ambulatory Visit: Payer: Medicare Other | Admitting: Interventional Cardiology

## 2017-04-27 VITALS — BP 106/60 | HR 48 | Ht 67.0 in | Wt 190.0 lb

## 2017-04-27 DIAGNOSIS — I10 Essential (primary) hypertension: Secondary | ICD-10-CM

## 2017-04-27 DIAGNOSIS — E7800 Pure hypercholesterolemia, unspecified: Secondary | ICD-10-CM

## 2017-04-27 DIAGNOSIS — E1159 Type 2 diabetes mellitus with other circulatory complications: Secondary | ICD-10-CM | POA: Diagnosis not present

## 2017-04-27 DIAGNOSIS — R001 Bradycardia, unspecified: Secondary | ICD-10-CM

## 2017-04-27 DIAGNOSIS — I25709 Atherosclerosis of coronary artery bypass graft(s), unspecified, with unspecified angina pectoris: Secondary | ICD-10-CM

## 2017-04-27 MED ORDER — ASPIRIN EC 81 MG PO TBEC: 81.0000 mg | DELAYED_RELEASE_TABLET | Freq: Every day | ORAL | 3 refills | Status: DC

## 2017-04-27 NOTE — Patient Instructions (Addendum)
Medication Instructions:  1) DECREASE Aspirin to 81mg  once daily 2) DECREASE Metoprolol to 12.5mg  (half tablet) once daily for 7-10 days and then discontinue.   Labwork: None  Testing/Procedures: None  Follow-Up: Your physician wants you to follow-up in: 1 year with Dr. Tamala Julian. You will receive a reminder letter in the mail two months in advance. If you don't receive a letter, please call our office to schedule the follow-up appointment.   Any Other Special Instructions Will Be Listed Below (If Applicable).     If you need a refill on your cardiac medications before your next appointment, please call your pharmacy.

## 2017-04-27 NOTE — Addendum Note (Signed)
Addended by: Loren Racer on: 04/27/2017 09:08 AM   Modules accepted: Orders

## 2017-06-09 ENCOUNTER — Other Ambulatory Visit: Payer: Self-pay | Admitting: Interventional Cardiology

## 2017-06-10 ENCOUNTER — Other Ambulatory Visit: Payer: Self-pay | Admitting: *Deleted

## 2017-06-10 MED ORDER — METOPROLOL SUCCINATE ER 25 MG PO TB24: 25.0000 mg | ORAL_TABLET | Freq: Every day | ORAL | 3 refills | Status: DC

## 2017-06-10 NOTE — Progress Notes (Signed)
Dr. Tamala Julian spoke with pt and decided to put pt back onto Metoprolol Succinate 25mg  QD.  Will send in prescription.

## 2018-04-27 ENCOUNTER — Encounter: Payer: Self-pay | Admitting: Interventional Cardiology

## 2018-04-27 ENCOUNTER — Ambulatory Visit (INDEPENDENT_AMBULATORY_CARE_PROVIDER_SITE_OTHER): Payer: Medicare Other | Admitting: Interventional Cardiology

## 2018-04-27 VITALS — BP 118/64 | HR 50 | Ht 67.0 in | Wt 186.0 lb

## 2018-04-27 DIAGNOSIS — E7800 Pure hypercholesterolemia, unspecified: Secondary | ICD-10-CM

## 2018-04-27 DIAGNOSIS — E1159 Type 2 diabetes mellitus with other circulatory complications: Secondary | ICD-10-CM

## 2018-04-27 DIAGNOSIS — R001 Bradycardia, unspecified: Secondary | ICD-10-CM | POA: Diagnosis not present

## 2018-04-27 DIAGNOSIS — I25709 Atherosclerosis of coronary artery bypass graft(s), unspecified, with unspecified angina pectoris: Secondary | ICD-10-CM

## 2018-04-27 DIAGNOSIS — I10 Essential (primary) hypertension: Secondary | ICD-10-CM | POA: Diagnosis not present

## 2018-04-27 NOTE — Patient Instructions (Signed)

## 2018-04-27 NOTE — Progress Notes (Signed)
Cardiology Office Note:    Date:  04/27/2018   ID:  Jonathan Lark, [name], DOB 09/29/1940, MRN [id number]  PCP:  [name], [name]  Cardiologist:  [name], [name]   Referring [name]: [name], [name]   Chief Complaint  Patient presents with  . Coronary Artery Disease    History of Present Illness:    Jonathan Lark, [name] is a 78 y.o. male with a hx of CAD, status post CABG2010with LIMA to LAD, history of out-of-hospital ventricular fibrillation cardiac arrest with successful resuscitation, essential hypertension, and diabetes mellitus.   [name] is not had any cardiac issues over the past 12 months.  He did have an orthopedic injury to the right Achilles which has significantly interfered with his aerobic activity routine.  It is taken about 3 months to heal, he is now back in the gym.  He is avoiding activities that stress the Achilles.  He has not had any neurological events.  He denies claudication.  No palpitations or syncope.  He denies angina or anginal equivalent.  There is no change in exertional tolerance.  Past Medical History:  Diagnosis Date  . CAD (coronary artery disease)   . Coronary atherosclerosis of unspecified type of vessel, native or graft   . Diabetes ([place])   . Hypercholesteremia   . Type II or unspecified type diabetes mellitus without mention of complication, not stated as uncontrolled     Past Surgical History:  Procedure Laterality Date  . APPENDECTOMY    . benign pigmented nevus    . CABG Dr. Servando Snare  May 2010  . fractured left wrist    . TONSILLECTOMY      Current Medications: Current Meds  Medication Sig  . alfuzosin (UROXATRAL) 10 MG 24 hr tablet Take 1 tablet by mouth daily.  Marland Kitchen aspirin EC 81 MG tablet Take 1 tablet (81 mg total) by mouth daily.  . finasteride (PROSCAR) 5 MG tablet Take 5 mg by mouth daily.  . metFORMIN (GLUCOPHAGE-XR) 500 MG 24 hr tablet Take 500 mg by mouth daily with breakfast.   . metoprolol succinate  (TOPROL-XL) 25 MG 24 hr tablet Take 1 tablet (25 mg total) by mouth daily.  . Multiple Vitamin (MULTIVITAMIN) capsule Take 1 capsule by mouth daily.  Marland Kitchen VYTORIN 10-80 MG tablet Take 1 tablet by mouth daily.     Allergies:   Tetracyclines & related and Bactrim [sulfamethoxazole-trimethoprim]   Social History   Socioeconomic History  . Marital status: Married    Spouse name: Not on file  . Number of children: Not on file  . Years of education: Not on file  . Highest education level: Not on file  Occupational History  . Not on file  Social Needs  . Financial resource strain: Not on file  . Food insecurity:    Worry: Not on file    Inability: Not on file  . Transportation needs:    Medical: Not on file    Non-medical: Not on file  Tobacco Use  . Smoking status: Never Smoker  . Smokeless tobacco: Never Used  Substance and Sexual Activity  . Alcohol use: Yes    Alcohol/week: 8.0 standard drinks    Types: 4 Cans of beer, 4 Standard drinks or equivalent per week  . Drug use: No  . Sexual activity: Not on file  Lifestyle  . Physical activity:    Days per week: Not on file    Minutes per session: Not on file  .  Stress: Not on file  Relationships  . Social connections:    Talks on phone: Not on file    Gets together: Not on file    Attends religious service: Not on file    Active member of club or organization: Not on file    Attends meetings of clubs or organizations: Not on file    Relationship status: Not on file  Other Topics Concern  . Not on file  Social History Narrative  . Not on file     Family History: The patient's family history includes Alzheimer's disease in his father; Breast cancer in his mother; CAD in his father; Diabetes in his mother; Healthy in his sister; Stroke in his father.  ROS:   Please see the history of present illness.    Partial right Achilles rupture.  Otherwise stable.  All other systems reviewed and are negative.  EKGs/Labs/Other  Studies Reviewed:    The following studies were reviewed today: No new functional or imaging data.  EKG:  EKG sinus bradycardia with first-degree AV block 234 ms.  Heart rate is 50 bpm.  QS pattern in V1 through V3.  When compared to April 27, 2017, no change has occurred.  Recent Labs: No results found for requested labs within last 8760 hours.  Recent Lipid Panel No results found for: CHOL, TRIG, HDL, CHOLHDL, VLDL, LDLCALC, LDLDIRECT  Physical Exam:    VS:  BP 118/64   Pulse (!) 50   Ht 5\' 7"  (1.702 m)   Wt 186 lb (84.4 kg)   SpO2 97%   BMI 29.13 kg/m     Wt Readings from Last 3 Encounters:  04/27/18 186 lb (84.4 kg)  04/27/17 190 lb (86.2 kg)  04/25/16 195 lb 12.8 oz (88.8 kg)     GEN: Weight. No acute distress HEENT: Normal NECK: No JVD. LYMPHATICS: No lymphadenopathy CARDIAC: RRR.  No murmur, no gallop, no edema VASCULAR: 2+ bilateral radial and carotid pulses, no carotid bruits RESPIRATORY:  Clear to auscultation without rales, wheezing or rhonchi  ABDOMEN: Soft, non-tender, non-distended, No pulsatile mass, MUSCULOSKELETAL: No deformity  SKIN: Warm and dry NEUROLOGIC:  Alert and oriented x 3 PSYCHIATRIC:  Normal affect   ASSESSMENT:    1. Coronary artery disease involving coronary bypass graft of native heart with angina pectoris ([place])   2. Essential hypertension   3. Sinus bradycardia   4. DM type 2 causing vascular disease ([place])   5. Hypercholesteremia    PLAN:    In order of problems listed above:  1. Stable CAD, status post LIMA to LAD. 2. Blood pressure is under excellent control less than 130/80 mmHg. 3. Bradycardia is unchanged.  Low-dose beta-blocker therapy will be continued. 4. Hemoglobin A1c is 7.  I would like for her to be lower.  Increase activity.  Decrease carbohydrate intake. 5. He is on combination of ezetimibe and simvastatin without complications/side effects.  Overall education and awareness concerning primary/secondary risk  prevention was discussed in detail: LDL less than 70, hemoglobin A1c less than 7, blood pressure target less than 130/80 mmHg, >150 minutes of moderate aerobic activity per week, avoidance of smoking, weight control (via diet and exercise), and continued surveillance/management of/for obstructive sleep apnea.  1 year follow-up   Medication Adjustments/Labs and Tests Ordered: Current medicines are reviewed at length with the patient today.  Concerns regarding medicines are outlined above.  Orders Placed This Encounter  Procedures  . EKG 12-Lead   No orders of the defined types  were placed in this encounter.   Patient Instructions  Medication Instructions:  Your physician recommends that you continue on your current medications as directed. Please refer to the Current Medication list given to you today.  If you need a refill on your cardiac medications before your next appointment, please call your pharmacy.   Lab work: None If you have labs (blood work) drawn today and your tests are completely normal, you will receive your results only by: Marland Kitchen MyChart Message (if you have MyChart) OR . A paper copy in the mail If you have any lab test that is abnormal or we need to change your treatment, we will call you to review the results.  Testing/Procedures: None  Follow-Up: At [facility], you and your health needs are our priority.  As part of our continuing mission to provide you with exceptional heart care, we have created designated Provider Care Teams.  These Care Teams include your primary Cardiologist (physician) and Advanced Practice Providers (APPs -  Physician Assistants and Nurse Practitioners) who all work together to provide you with the care you need, when you need it. You will need a follow up appointment in 12 months.  Please call our office 2 months in advance to schedule this appointment.  You may see [name], [name] or one of the following Advanced Practice Providers  on your designated Care Team:   Truitt Merle, NP Cecilie Kicks, NP . Kathyrn Drown, NP  Any Other Special Instructions Will Be Listed Below (If Applicable).       Signed, [name], [name]  04/27/2018 2:55 PM    [place]

## 2018-06-23 ENCOUNTER — Other Ambulatory Visit: Payer: Self-pay | Admitting: Interventional Cardiology

## 2018-10-01 ENCOUNTER — Encounter (HOSPITAL_COMMUNITY): Payer: Self-pay | Admitting: Emergency Medicine

## 2018-10-01 ENCOUNTER — Emergency Department (HOSPITAL_COMMUNITY)
Admission: EM | Admit: 2018-10-01 | Discharge: 2018-10-02 | Disposition: A | Discharge status: Home / Self Care | Payer: Medicare Other | Attending: Emergency Medicine | Admitting: Emergency Medicine

## 2018-10-01 ENCOUNTER — Emergency Department (HOSPITAL_COMMUNITY): Payer: Medicare Other

## 2018-10-01 ENCOUNTER — Other Ambulatory Visit: Payer: Self-pay

## 2018-10-01 DIAGNOSIS — R1110 Vomiting, unspecified: Secondary | ICD-10-CM | POA: Diagnosis not present

## 2018-10-01 DIAGNOSIS — I10 Essential (primary) hypertension: Secondary | ICD-10-CM | POA: Diagnosis not present

## 2018-10-01 DIAGNOSIS — R1013 Epigastric pain: Secondary | ICD-10-CM | POA: Diagnosis present

## 2018-10-01 DIAGNOSIS — R112 Nausea with vomiting, unspecified: Secondary | ICD-10-CM

## 2018-10-01 DIAGNOSIS — Z03818 Encounter for observation for suspected exposure to other biological agents ruled out: Secondary | ICD-10-CM | POA: Insufficient documentation

## 2018-10-01 DIAGNOSIS — N289 Disorder of kidney and ureter, unspecified: Secondary | ICD-10-CM

## 2018-10-01 DIAGNOSIS — E119 Type 2 diabetes mellitus without complications: Secondary | ICD-10-CM | POA: Insufficient documentation

## 2018-10-01 DIAGNOSIS — I2510 Atherosclerotic heart disease of native coronary artery without angina pectoris: Secondary | ICD-10-CM | POA: Diagnosis not present

## 2018-10-01 DIAGNOSIS — R1010 Upper abdominal pain, unspecified: Secondary | ICD-10-CM | POA: Diagnosis not present

## 2018-10-01 LAB — PROTIME-INR
INR: 1.1 (ref 0.8–1.2)
Prothrombin Time: 14.5 seconds (ref 11.4–15.2)

## 2018-10-01 LAB — URINALYSIS, ROUTINE W REFLEX MICROSCOPIC
Bilirubin Urine: NEGATIVE
Glucose, UA: NEGATIVE mg/dL
Hgb urine dipstick: NEGATIVE
Ketones, ur: 5 mg/dL — AB
Leukocytes,Ua: NEGATIVE
Nitrite: NEGATIVE
Protein, ur: NEGATIVE mg/dL
Specific Gravity, Urine: 1.012 (ref 1.005–1.030)
pH: 6 (ref 5.0–8.0)

## 2018-10-01 LAB — COMPREHENSIVE METABOLIC PANEL
ALT: 17 U/L (ref 0–44)
AST: 22 U/L (ref 15–41)
Albumin: 4.2 g/dL (ref 3.5–5.0)
Alkaline Phosphatase: 64 U/L (ref 38–126)
Anion gap: 12 (ref 5–15)
BUN: 19 mg/dL (ref 8–23)
CO2: 25 mmol/L (ref 22–32)
Calcium: 9.9 mg/dL (ref 8.9–10.3)
Chloride: 100 mmol/L (ref 98–111)
Creatinine, Ser: 1.64 mg/dL — ABNORMAL HIGH (ref 0.61–1.24)
GFR calc Af Amer: 45 mL/min — ABNORMAL LOW (ref >60)
GFR calc non Af Amer: 39 mL/min — ABNORMAL LOW (ref >60)
Glucose, Bld: 176 mg/dL — ABNORMAL HIGH (ref 70–99)
Potassium: 4.3 mmol/L (ref 3.5–5.1)
Sodium: 137 mmol/L (ref 135–145)
Total Bilirubin: 0.9 mg/dL (ref 0.3–1.2)
Total Protein: 7.7 g/dL (ref 6.5–8.1)

## 2018-10-01 LAB — CBC WITH DIFFERENTIAL/PLATELET
Abs Immature Granulocytes: 0.08 10*3/uL — ABNORMAL HIGH (ref 0.00–0.07)
Basophils Absolute: 0 10*3/uL (ref 0.0–0.1)
Basophils Relative: 0 %
Eosinophils Absolute: 0 10*3/uL (ref 0.0–0.5)
Eosinophils Relative: 0 %
HCT: 45.4 % (ref 39.0–52.0)
Hemoglobin: 15.3 g/dL (ref 13.0–17.0)
Immature Granulocytes: 0 %
Lymphocytes Relative: 8 %
Lymphs Abs: 1.4 10*3/uL (ref 0.7–4.0)
MCH: 30.2 pg (ref 26.0–34.0)
MCHC: 33.7 g/dL (ref 30.0–36.0)
MCV: 89.7 fL (ref 80.0–100.0)
Monocytes Absolute: 0.6 10*3/uL (ref 0.1–1.0)
Monocytes Relative: 4 %
Neutro Abs: 15.8 10*3/uL — ABNORMAL HIGH (ref 1.7–7.7)
Neutrophils Relative %: 88 %
Platelets: 250 10*3/uL (ref 150–400)
RBC: 5.06 MIL/uL (ref 4.22–5.81)
RDW: 13.2 % (ref 11.5–15.5)
WBC: 18 10*3/uL — ABNORMAL HIGH (ref 4.0–10.5)
nRBC: 0 % (ref 0.0–0.2)

## 2018-10-01 LAB — LIPASE, BLOOD: Lipase: 28 U/L (ref 11–51)

## 2018-10-01 LAB — TROPONIN I (HIGH SENSITIVITY)
Troponin I (High Sensitivity): 87 ng/L — ABNORMAL HIGH (ref <18)
Troponin I (High Sensitivity): 81 ng/L — ABNORMAL HIGH (ref <18)

## 2018-10-01 LAB — SARS CORONAVIRUS 2 BY RT PCR (HOSPITAL ORDER, PERFORMED IN ~~LOC~~ HOSPITAL LAB): SARS Coronavirus 2: NEGATIVE

## 2018-10-01 MED ORDER — IOHEXOL 300 MG/ML  SOLN
100.0000 mL | Freq: Once | INTRAMUSCULAR | Status: AC | PRN
  Administered 2018-10-01: 100 mL via INTRAVENOUS

## 2018-10-01 MED ORDER — SODIUM CHLORIDE 0.9 % IV BOLUS
500.0000 mL | Freq: Once | INTRAVENOUS | Status: AC
  Administered 2018-10-01: 500 mL via INTRAVENOUS

## 2018-10-01 MED ORDER — ONDANSETRON 8 MG PO TBDP: 8.0000 mg | ORAL_TABLET | Freq: Three times a day (TID) | ORAL | 0 refills | Status: DC | PRN

## 2018-10-01 MED ORDER — ONDANSETRON HCL 4 MG/2ML IJ SOLN
4.0000 mg | Freq: Once | INTRAMUSCULAR | Status: AC
  Administered 2018-10-01: 4 mg via INTRAVENOUS
  Filled 2018-10-01: qty 2

## 2018-10-01 MED ORDER — MORPHINE SULFATE (PF) 4 MG/ML IV SOLN
4.0000 mg | Freq: Once | INTRAVENOUS | Status: AC
  Administered 2018-10-01: 4 mg via INTRAVENOUS
  Filled 2018-10-01: qty 1

## 2018-10-01 NOTE — ED Provider Notes (Signed)
Emergency Department Provider Note   I have reviewed the triage vital signs and the nursing notes.   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  Patient has had 2 days of symptoms with vomiting.  No diarrhea.  No respiratory symptoms.  No fever.  Patient has not had any sick contacts or travel.  Pain is epigastric to slightly left-sided.  Not significantly worse with eating although appetite has been poor.  Prior history of appendectomy as a child. No CP or SOB.    Past Medical History:  Diagnosis Date  . CAD (coronary artery disease)   . Coronary atherosclerosis of unspecified type of vessel, native or graft   . Diabetes ([place])   . Hypercholesteremia   . Type II or unspecified type diabetes mellitus without mention of complication, not stated as uncontrolled     Patient Active Problem List   Diagnosis Date Noted  . Sinus bradycardia 04/27/2017  . Essential hypertension 04/24/2016  . Hypercholesteremia   . DM type 2 causing vascular disease ([place])   . Coronary artery disease involving coronary bypass graft of native heart with angina pectoris [facility])     Past Surgical History:  Procedure Laterality Date  . APPENDECTOMY    . benign pigmented nevus    . CABG Dr. Servando Snare  May 2010  . fractured left wrist    . TONSILLECTOMY      Allergies Tetracyclines & related and Bactrim [sulfamethoxazole-trimethoprim]  Family History  Problem Relation Age of Onset  . Alzheimer's disease Father   . CAD Father   . Stroke Father   . Diabetes Mother   . Breast cancer Mother   . Healthy Sister     Social History Social History   Tobacco Use  . Smoking status: Never Smoker  . Smokeless tobacco: Never Used  Substance Use Topics  . Alcohol use: Yes    Alcohol/week: 8.0 standard drinks    Types: 4 Cans of beer, 4 Standard  drinks or equivalent per week  . Drug use: No    Review of Systems  Constitutional: Positive fever.  Eyes: No visual changes. ENT: No sore throat. Cardiovascular: Denies chest pain. Respiratory: Denies shortness of breath. Gastrointestinal: Epigastric abdominal pain. Positive  Nausea, vomiting.  No diarrhea.  No constipation. Genitourinary: Negative for dysuria. Musculoskeletal: Negative for back pain. Skin: Negative for rash. Neurological: Negative for headaches, focal weakness or numbness.  10-point ROS otherwise negative.  ____________________________________________   PHYSICAL EXAM:  VITAL SIGNS: ED Triage Vitals  Enc Vitals Group     BP 10/01/18 1927 (!) 149/83     Pulse Rate 10/01/18 1927 77     Resp 10/01/18 1927 16     Temp 10/01/18 1927 98 F (36.7 C)     Temp Source 10/01/18 1927 Oral     SpO2 10/01/18 1927 99 %     Weight 10/01/18 2151 175 lb (79.4 kg)     Height 10/01/18 2151 5\' 7"  (1.702 m)    Constitutional: Alert and oriented. Well appearing and in no acute distress. Eyes: Conjunctivae are normal.  Head: Atraumatic. Nose: No congestion/rhinnorhea. Mouth/Throat: Mucous membranes are moist.  Neck: No stridor.  Cardiovascular: Normal rate, regular rhythm. Good peripheral circulation. Grossly normal heart sounds.   Respiratory: Normal respiratory effort.  No retractions. Lungs CTAB. Gastrointestinal: Soft with mid-epigastric tenderness. No  RUQ tenderness. No rebound or guarding. No distention.  Musculoskeletal: No gross deformities of extremities. Neurologic:  Normal speech and language. Skin:  Skin is warm, dry and intact. No rash noted.  ____________________________________________   LABS (all labs ordered are listed, but only abnormal results are displayed)  Labs Reviewed  CBC WITH DIFFERENTIAL/PLATELET - Abnormal; Notable for the following components:      Result Value   WBC 18.0 (*)    Neutro Abs 15.8 (*)    Abs Immature Granulocytes 0.08  (*)    All other components within normal limits  COMPREHENSIVE METABOLIC PANEL - Abnormal; Notable for the following components:   Glucose, Bld 176 (*)    Creatinine, Ser 1.64 (*)    GFR calc non Af Amer 39 (*)    GFR calc Af Amer 45 (*)    All other components within normal limits  TROPONIN I (HIGH SENSITIVITY) - Abnormal; Notable for the following components:   Troponin I (High Sensitivity) 87 (*)    All other components within normal limits  TROPONIN I (HIGH SENSITIVITY) - Abnormal; Notable for the following components:   Troponin I (High Sensitivity) 81 (*)    All other components within normal limits  URINALYSIS, ROUTINE W REFLEX MICROSCOPIC - Abnormal; Notable for the following components:   Ketones, ur 5 (*)    All other components within normal limits  SARS CORONAVIRUS 2 (HOSPITAL ORDER, [facility])  LIPASE, BLOOD  PROTIME-INR   ____________________________________________  EKG   EKG Interpretation  Date/Time:  Friday October 01 2018 20:34:24 EDT Ventricular Rate:  61 PR Interval:    QRS Duration: 84 QT Interval:  412 QTC Calculation: 415 R Axis:   66 Text Interpretation:  Sinus rhythm Prolonged PR interval Anterior infarct, old Minimal ST elevation, lateral leads Baseline wander in lead(s) V5 No STEMI  Confirmed by Nanda Quinton [phone]) on 10/01/2018 8:51:58 PM       ____________________________________________  RADIOLOGY  CT abdomen/pelvis pending.  ____________________________________________   PROCEDURES  Procedure(s) performed:   Procedures  None ____________________________________________   INITIAL IMPRESSION / ASSESSMENT AND PLAN / ED COURSE  Pertinent labs & imaging results that were available during my care of the patient were reviewed by me and considered in my medical decision making (see chart for details).   Patient presents to the emergency department for evaluation of epigastric pain.  Low suspicion for ACS.   White count 18.  Lipase normal.  CT imaging pending with focal tenderness in the epigastric and left upper quadrant region.  Care transferred to Dr. Roxanne Mins pending CT abdomen/pelvis and repeat troponin.  ____________________________________________  FINAL CLINICAL IMPRESSION(S) / ED DIAGNOSES  Final diagnoses:  Pain of upper abdomen  Non-intractable vomiting with nausea, unspecified vomiting type  Renal insufficiency     MEDICATIONS GIVEN DURING THIS VISIT:  Medications  sodium chloride 0.9 % bolus 500 mL (0 mLs Intravenous Stopped 10/01/18 2336)  morphine 4 MG/ML injection 4 mg (4 mg Intravenous Given 10/01/18 2248)  ondansetron (ZOFRAN) injection 4 mg (4 mg Intravenous Given 10/01/18 2201)  iohexol (OMNIPAQUE) 300 MG/ML solution 100 mL (100 mLs Intravenous Contrast Given 10/01/18 2311)     NEW OUTPATIENT MEDICATIONS STARTED DURING THIS VISIT:  Discharge Medication List as of 10/02/2018 12:05 AM    START taking these medications   Details  ondansetron (ZOFRAN-ODT) 8 MG disintegrating tablet Take 1 tablet (8 mg total) by mouth every 8 (eight) hours as needed for nausea or vomiting., Starting Fri  10/01/2018, Normal        Note:  This document was prepared using Dragon voice recognition software and may include unintentional dictation errors.  Nanda Quinton, MD Emergency Medicine    , [facility], MD 10/02/18 1247

## 2018-10-01 NOTE — ED Notes (Signed)
Pt transported to CT ?

## 2018-10-01 NOTE — ED Triage Notes (Addendum)
Patient reports worsening pain across upper abdomen with emesis and low grade fever onset yesterday . Denies diarrhea .

## 2018-10-02 NOTE — ED Notes (Signed)
Pt verbalizes understanding of d/c instructions. Prescriptions reviewed with patient. Pt ambulatory at d/c with all belongings.  

## 2018-10-02 NOTE — Discharge Instructions (Signed)
Return if pain is getting worse, or if vomiting is not being adequately controlled at home.

## 2018-10-02 NOTE — ED Provider Notes (Signed)
[name] assumed from Dr. Laverta Baltimore, patient with abdominal pain and vomiting, also indeterminate troponin.  Labs showed mild renal insufficiency as well as elevated WBC.  Case was signed out pending repeat troponin and CT of abdomen and pelvis.  Repeat troponin is unchanged.  CT of abdomen and pelvis is consistent with mild colitis versus early partial small bowel obstruction.  I reexamined the patient and his abdomen is completely benign.  He states he is completely pain-free.  In light of WBC, I have discussed possible hospital admission for repeat WBC and reexam, but he insists he would prefer to go home.  He is given strict return precautions.  Given benign abdominal exam at this time, I feel this is reasonable.  He is given a prescription for ondansetron oral dissolving tablet to use at home if nausea recurs.  Otherwise, follow-up with PCP.  Results for orders placed or performed during the hospital encounter of 10/01/18  SARS Coronavirus 2 (CEPHEID - Performed in [place] hospital lab), [facility] Order   Specimen: Nasopharyngeal Swab  Result Value Ref Range   SARS Coronavirus 2 NEGATIVE NEGATIVE  CBC with Differential  Result Value Ref Range   WBC 18.0 (H) 4.0 - 10.5 K/uL   RBC 5.06 4.22 - 5.81 MIL/uL   Hemoglobin 15.3 13.0 - 17.0 g/dL   HCT 45.4 39.0 - 52.0 %   MCV 89.7 80.0 - 100.0 fL   MCH 30.2 26.0 - 34.0 pg   MCHC 33.7 30.0 - 36.0 g/dL   RDW 13.2 11.5 - 15.5 %   Platelets 250 150 - 400 K/uL   nRBC 0.0 0.0 - 0.2 %   Neutrophils Relative % 88 %   Neutro Abs 15.8 (H) 1.7 - 7.7 K/uL   Lymphocytes Relative 8 %   Lymphs Abs 1.4 0.7 - 4.0 K/uL   Monocytes Relative 4 %   Monocytes Absolute 0.6 0.1 - 1.0 K/uL   Eosinophils Relative 0 %   Eosinophils Absolute 0.0 0.0 - 0.5 K/uL   Basophils Relative 0 %   Basophils Absolute 0.0 0.0 - 0.1 K/uL   Immature Granulocytes 0 %   Abs Immature Granulocytes 0.08 (H) 0.00 - 0.07 K/uL  Comprehensive metabolic panel  Result Value Ref Range   Sodium 137 135  - 145 mmol/L   Potassium 4.3 3.5 - 5.1 mmol/L   Chloride 100 98 - 111 mmol/L   CO2 25 22 - 32 mmol/L   Glucose, Bld 176 (H) 70 - 99 mg/dL   BUN 19 8 - 23 mg/dL   Creatinine, Ser 1.64 (H) 0.61 - 1.24 mg/dL   Calcium 9.9 8.9 - 10.3 mg/dL   Total Protein 7.7 6.5 - 8.1 g/dL   Albumin 4.2 3.5 - 5.0 g/dL   AST 22 15 - 41 U/L   ALT 17 0 - 44 U/L   Alkaline Phosphatase 64 38 - 126 U/L   Total Bilirubin 0.9 0.3 - 1.2 mg/dL   GFR calc non Af Amer 39 (L) >60 mL/min   GFR calc Af Amer 45 (L) >60 mL/min   Anion gap 12 5 - 15  Lipase, blood  Result Value Ref Range   Lipase 28 11 - 51 U/L  Protime-INR  Result Value Ref Range   Prothrombin Time 14.5 11.4 - 15.2 seconds   INR 1.1 0.8 - 1.2  Troponin I (High Sensitivity)  Result Value Ref Range   Troponin I (High Sensitivity) 87 (H) <18 ng/L  Troponin I (High Sensitivity)  Result Value  Ref Range   Troponin I (High Sensitivity) 81 (H) <18 ng/L  Urinalysis, Routine w reflex microscopic  Result Value Ref Range   Color, Urine YELLOW YELLOW   APPearance CLEAR CLEAR   Specific Gravity, Urine 1.012 1.005 - 1.030   pH 6.0 5.0 - 8.0   Glucose, UA NEGATIVE NEGATIVE mg/dL   Hgb urine dipstick NEGATIVE NEGATIVE   Bilirubin Urine NEGATIVE NEGATIVE   Ketones, ur 5 (A) NEGATIVE mg/dL   Protein, ur NEGATIVE NEGATIVE mg/dL   Nitrite NEGATIVE NEGATIVE   Leukocytes,Ua NEGATIVE NEGATIVE   Ct Abdomen Pelvis W Contrast  Result Date: 10/01/2018 CLINICAL DATA:  78 y/o M; abdominal pain radiating to lower chest and flanks with nausea and vomiting for 3 days. EXAM: CT ABDOMEN AND PELVIS WITH CONTRAST TECHNIQUE: Multidetector CT imaging of the abdomen and pelvis was performed using the standard protocol following bolus administration of intravenous contrast. CONTRAST:  160mL OMNIPAQUE IOHEXOL 300 MG/ML  SOLN COMPARISON:  06/20/2015 CT abdomen and pelvis. FINDINGS: Lower chest: Coronary artery calcific atherosclerosis. Partially visualized sternotomy postsurgical  changes. Hepatobiliary: No focal liver abnormality is seen. No gallstones, gallbladder wall thickening, or biliary dilatation. Pancreas: Unremarkable. No pancreatic ductal dilatation or surrounding inflammatory changes. Spleen: Normal in size without focal abnormality. Adrenals/Urinary Tract: Normal adrenal glands. Stable left kidney lower pole 4 mm stone and interpolar cyst. No hydronephrosis or urinary stone identified. Bladder is unremarkable. Stomach/Bowel: Stomach is within normal limits. Appendix not identified. Sigmoid diverticulosis without findings of acute diverticulitis. The colon is stool-filled and mildly distended as is the distal small bowel. No bowel wall thickening. Vascular/Lymphatic: Aortic atherosclerosis. No enlarged abdominal or pelvic lymph nodes. Reproductive: Prostate enlargement measuring 5.1 x 6.2 x 6.8 cm (volume = 110 cm^3). Other: Stable small periumbilical hernia containing fat. Musculoskeletal: No fracture is seen. Degenerative changes of the spine predominantly thoracolumbar junction and L3-4 level. Sacroiliac joint degenerative changes. IMPRESSION: 1. Stool-filled colon and mildly dilated distal small bowel may represent constipation, ileus, or possibly partial small-bowel obstruction. No bowel wall thickening or findings of perforation. 2. Stable left kidney lower pole nonobstructing nephrolithiasis. 3. Coronary artery and aortic calcific atherosclerosis. 4. Sigmoid diverticulosis without findings of acute diverticulitis. 5. Stable small periumbilical hernia containing fat. Electronically Signed   By: Kristine Garbe M.D.   On: 10/01/2018 23:41   Images viewed by me.    Jonathan Fuel, MD 80/67/54 [phone]

## 2018-10-02 NOTE — ED Notes (Signed)
Pt's wife given update over phone, ok per patient

## 2018-10-07 ENCOUNTER — Other Ambulatory Visit: Payer: Self-pay | Admitting: Physician Assistant

## 2018-10-07 DIAGNOSIS — R1013 Epigastric pain: Secondary | ICD-10-CM

## 2018-10-07 DIAGNOSIS — R1033 Periumbilical pain: Secondary | ICD-10-CM

## 2018-10-07 DIAGNOSIS — D72829 Elevated white blood cell count, unspecified: Secondary | ICD-10-CM

## 2018-10-07 DIAGNOSIS — K567 Ileus, unspecified: Secondary | ICD-10-CM

## 2018-10-14 ENCOUNTER — Ambulatory Visit
Admission: RE | Admit: 2018-10-14 | Discharge: 2018-10-14 | Disposition: A | Discharge status: Home / Self Care | Payer: Medicare Other | Source: Ambulatory Visit | Attending: Physician Assistant | Admitting: Physician Assistant

## 2018-10-14 DIAGNOSIS — K567 Ileus, unspecified: Secondary | ICD-10-CM

## 2018-10-14 DIAGNOSIS — R1013 Epigastric pain: Secondary | ICD-10-CM

## 2018-10-14 DIAGNOSIS — D72829 Elevated white blood cell count, unspecified: Secondary | ICD-10-CM

## 2018-10-14 DIAGNOSIS — R1033 Periumbilical pain: Secondary | ICD-10-CM

## 2019-03-17 ENCOUNTER — Other Ambulatory Visit: Payer: Self-pay | Admitting: Interventional Cardiology

## 2019-06-02 NOTE — Progress Notes (Signed)
Cardiology Office Note:    Date:  06/03/2019   ID:  Jonathan Lark, MD, DOB [birth year], MRN [id number]  PCP:  [name], MD  Cardiologist:  [name], MD   Referring MD: [name], MD   Chief Complaint  Patient presents with  . Coronary Artery Disease  . 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  Prior to the pandemic he exercised greater than 1 hour/day.  Since the pandemic he is not gone to exercise facilities.  He does walk an hour to an hour 15 minutes most days.  He walks briskly without discomfort or dyspnea.  He really has had no cardiac complaints.  He denies orthopnea, PND, lower extremity swelling, palpitations, edema, and neurological symptoms.  Dr. Felipa Eth has controlled his risk factors impeccably.  Hemoglobin A1c remains around 7, LDL is less than 70, he is physically active, he has gained a little weight.  He not on preventive therapy other than high intensity statin.  Also on low-dose beta-blocker therapy and aspirin.  Kidney function is normal.  An ACE/ARB should likely be considered.  Past Medical History:  Diagnosis Date  . CAD (coronary artery disease)   . Coronary atherosclerosis of unspecified type of vessel, native or graft   . Diabetes ([place])   . Hypercholesteremia   . Type II or unspecified type diabetes mellitus without mention of complication, not stated as uncontrolled     Past Surgical History:  Procedure Laterality Date  . APPENDECTOMY    . benign pigmented nevus    . CABG Dr. Servando Snare  May 2010  . fractured left wrist    . TONSILLECTOMY      Current Medications: Current Meds  Medication Sig  . alfuzosin (UROXATRAL) 10 MG 24 hr tablet Take 1 tablet by mouth daily.  Marland Kitchen  aspirin EC 81 MG tablet Take 1 tablet (81 mg total) by mouth daily.  . finasteride (PROSCAR) 5 MG tablet Take 5 mg by mouth daily.  . metFORMIN (GLUCOPHAGE-XR) 500 MG 24 hr tablet Take 500 mg by mouth daily with breakfast.   . metoprolol succinate (TOPROL-XL) 25 MG 24 hr tablet Take 1 tablet (25 mg total) by mouth daily.  . Multiple Vitamin (MULTIVITAMIN) capsule Take 1 capsule by mouth daily.  Marland Kitchen VYTORIN 10-80 MG tablet Take 1 tablet by mouth daily.  . [DISCONTINUED] metoprolol succinate (TOPROL-XL) 25 MG 24 hr tablet Take 1 tablet (25 mg total) by mouth daily. PLEASE MAKE FOLLOW-UP APPT FOR FUTURE REFILLS. [id number]     Allergies:   Tetracyclines & related and Bactrim [sulfamethoxazole-trimethoprim]   Social History   Socioeconomic History  . Marital status: Married    Spouse name: Not on file  . Number of children: Not on file  . Years of education: Not on file  . Highest education level: Not on file  Occupational History  . Not on file  Tobacco Use  . Smoking status: Never Smoker  . Smokeless tobacco: Never Used  Substance and Sexual Activity  . Alcohol use: Yes    Alcohol/week: 8.0 standard drinks    Types: 4 Cans of beer, 4 Standard drinks or equivalent per week  . Drug use: No  . Sexual activity: Not on file  Other Topics Concern  .  Not on file  Social History Narrative  . Not on file   Social Determinants of Health   Financial Resource Strain:   . Difficulty of Paying Living Expenses: Not on file  Food Insecurity:   . Worried About Charity fundraiser in the Last Year: Not on file  . Ran Out of Food in the Last Year: Not on file  Transportation Needs:   . Lack of Transportation (Medical): Not on file  . Lack of Transportation (Non-Medical): Not on file  Physical Activity:   . Days of Exercise per Week: Not on file  . Minutes of Exercise per Session: Not on file  Stress:   . Feeling of Stress : Not on file  Social Connections:   . Frequency of Communication  with Friends and Family: Not on file  . Frequency of Social Gatherings with Friends and Family: Not on file  . Attends Religious Services: Not on file  . Active Member of Clubs or Organizations: Not on file  . Attends Archivist Meetings: Not on file  . Marital Status: Not on file     Family History: The patient's family history includes Alzheimer's disease in his father; Breast cancer in his mother; CAD in his father; Diabetes in his mother; Healthy in his sister; Stroke in his father.  ROS:   Please see the history of present illness.    Had abdominal pain in July.  Diverticulosis was noted but no specific explanation was found.  He did have leukocytosis but it resolved spontaneously.  No recurrence after starting MiraLAX on a regular basis.  All other systems reviewed and are negative.  EKGs/Labs/Other Studies Reviewed:    The following studies were reviewed today: Cardiac imaging or functional data.  We did discuss doing a stress test this year but he resisted.  He makes a good point that he is physically active, has not noticed a change in exertional tolerance, and has no symptoms.  EKG:  EKG the most recent electrocardiogram from July 2020 demonstrated normal sinus rhythm V1 through V4.  Left atrial abnormality.  Sinus bradycardia.  Performed 10/04/2018.  Recent Labs: 10/01/2018: ALT 17; BUN 19; Creatinine, Ser 1.64; Hemoglobin 15.3; Platelets 250; Potassium 4.3; Sodium 137  Recent Lipid Panel No results found for: CHOL, TRIG, HDL, CHOLHDL, VLDL, LDLCALC, LDLDIRECT  Physical Exam:    VS:  BP 118/64   Pulse (!) 52   Ht 5\' 7"  (1.702 m)   Wt 191 lb 6.4 oz (86.8 kg)   SpO2 98%   BMI 29.98 kg/m     Wt Readings from Last 3 Encounters:  06/03/19 191 lb 6.4 oz (86.8 kg)  10/01/18 175 lb (79.4 kg)  04/27/18 186 lb (84.4 kg)     GEN: Mild overweight. No acute distress HEENT: Normal NECK: No JVD. LYMPHATICS: No lymphadenopathy CARDIAC:  RRR without murmur, gallop, or  edema. VASCULAR:  Normal Pulses. No bruits. RESPIRATORY:  Clear to auscultation without rales, wheezing or rhonchi  ABDOMEN: Soft, non-tender, non-distended, No pulsatile mass, MUSCULOSKELETAL: No deformity  SKIN: Warm and dry NEUROLOGIC:  Alert and oriented x 3 PSYCHIATRIC:  Normal affect   ASSESSMENT:    1. Coronary artery disease involving coronary bypass graft of native heart with angina pectoris ([place])   2. Essential hypertension   3. DM type 2 causing vascular disease ([place])   4. Hypercholesteremia   5. Sinus bradycardia   6. Educated about COVID-19 virus infection    PLAN:    In  order of problems listed above:  1. Secondary prevention discussed in detail.  Tells me that he would never consider having a second open heart operation.  I let him know he is unlikely to ever require this since he had LIMA to LAD.  Any other disease would likely be treatable with PCI if symptoms warranted. 2. Target 130/80 mmHg or less. 3. Target hemoglobin A1c 7.  Most recent labs was 7.1. 4. Most recent LDL was 68 on ezetimibe/simvastatin. 5. Persistent bradycardia but no symptoms. 6. He has received 2 doses of the Moderna vaccine without issues.  Overall education and awareness concerning primary/secondary risk prevention was discussed in detail: LDL less than 70, hemoglobin A1c less than 7, blood pressure target less than 130/80 mmHg, >150 minutes of moderate aerobic activity per week, avoidance of smoking, weight control (via diet and exercise), and continued surveillance/management of/for obstructive sleep apnea.      Medication Adjustments/Labs and Tests Ordered: Current medicines are reviewed at length with the patient today.  Concerns regarding medicines are outlined above.  No orders of the defined types were placed in this encounter.  Meds ordered this encounter  Medications  . metoprolol succinate (TOPROL-XL) 25 MG 24 hr tablet    Sig: Take 1 tablet (25 mg total) by mouth daily.     Dispense:  90 tablet    Refill:  3    Patient Instructions  Medication Instructions:  Your physician recommends that you continue on your current medications as directed. Please refer to the Current Medication list given to you today.  *If you need a refill on your cardiac medications before your next appointment, please call your pharmacy*   Lab Work: None If you have labs (blood work) drawn today and your tests are completely normal, you will receive your results only by: Marland Kitchen MyChart Message (if you have MyChart) OR . A paper copy in the mail If you have any lab test that is abnormal or we need to change your treatment, we will call you to review the results.   Testing/Procedures: None   Follow-Up: At [facility], you and your health needs are our priority.  As part of our continuing mission to provide you with exceptional heart care, we have created designated Provider Care Teams.  These Care Teams include your primary Cardiologist (physician) and Advanced Practice Providers (APPs -  Physician Assistants and Nurse Practitioners) who all work together to provide you with the care you need, when you need it.  We recommend signing up for the patient portal called "MyChart".  Sign up information is provided on this After Visit Summary.  MyChart is used to connect with patients for Virtual Visits (Telemedicine).  Patients are able to view lab/test results, encounter notes, upcoming appointments, etc.  Non-urgent messages can be sent to your provider as well.   To learn more about what you can do with MyChart, go to [email or web address].    Your next appointment:   12 month(s)  The format for your next appointment:   In Person  Provider:   You may see [name], MD or one of the following Advanced Practice Providers on your designated Care Team:    Truitt Merle, NP  Cecilie Kicks, NP  Kathyrn Drown, NP    Other Instructions      Signed, [name],  MD  06/03/2019 2:31 PM    [place]

## 2019-06-03 ENCOUNTER — Other Ambulatory Visit: Payer: Self-pay

## 2019-06-03 ENCOUNTER — Encounter: Payer: Self-pay | Admitting: Interventional Cardiology

## 2019-06-03 ENCOUNTER — Ambulatory Visit: Payer: Medicare Other | Admitting: Interventional Cardiology

## 2019-06-03 VITALS — BP 118/64 | HR 52 | Ht 67.0 in | Wt 191.4 lb

## 2019-06-03 DIAGNOSIS — I10 Essential (primary) hypertension: Secondary | ICD-10-CM | POA: Diagnosis not present

## 2019-06-03 DIAGNOSIS — R001 Bradycardia, unspecified: Secondary | ICD-10-CM

## 2019-06-03 DIAGNOSIS — E1159 Type 2 diabetes mellitus with other circulatory complications: Secondary | ICD-10-CM | POA: Diagnosis not present

## 2019-06-03 DIAGNOSIS — E7800 Pure hypercholesterolemia, unspecified: Secondary | ICD-10-CM

## 2019-06-03 DIAGNOSIS — I25709 Atherosclerosis of coronary artery bypass graft(s), unspecified, with unspecified angina pectoris: Secondary | ICD-10-CM

## 2019-06-03 DIAGNOSIS — Z7189 Other specified counseling: Secondary | ICD-10-CM

## 2019-06-03 MED ORDER — METOPROLOL SUCCINATE ER 25 MG PO TB24: 25.0000 mg | ORAL_TABLET | Freq: Every day | ORAL | 3 refills | Status: DC

## 2019-06-03 NOTE — Patient Instructions (Signed)

## 2019-10-06 ENCOUNTER — Other Ambulatory Visit: Payer: Self-pay | Admitting: Geriatric Medicine

## 2019-10-06 ENCOUNTER — Ambulatory Visit
Admission: RE | Admit: 2019-10-06 | Discharge: 2019-10-06 | Disposition: A | Discharge status: Home / Self Care | Payer: Medicare Other | Source: Ambulatory Visit | Attending: Geriatric Medicine | Admitting: Geriatric Medicine

## 2019-10-06 DIAGNOSIS — R059 Cough, unspecified: Secondary | ICD-10-CM

## 2020-04-25 DIAGNOSIS — Z961 Presence of intraocular lens: Secondary | ICD-10-CM | POA: Diagnosis not present

## 2020-04-25 DIAGNOSIS — E119 Type 2 diabetes mellitus without complications: Secondary | ICD-10-CM | POA: Diagnosis not present

## 2020-04-26 DIAGNOSIS — M791 Myalgia, unspecified site: Secondary | ICD-10-CM | POA: Diagnosis not present

## 2020-05-25 ENCOUNTER — Other Ambulatory Visit: Payer: Self-pay | Admitting: Interventional Cardiology

## 2020-06-11 DIAGNOSIS — R350 Frequency of micturition: Secondary | ICD-10-CM | POA: Diagnosis not present

## 2020-06-11 DIAGNOSIS — N3001 Acute cystitis with hematuria: Secondary | ICD-10-CM | POA: Diagnosis not present

## 2020-06-25 DIAGNOSIS — R3914 Feeling of incomplete bladder emptying: Secondary | ICD-10-CM | POA: Diagnosis not present

## 2020-06-25 DIAGNOSIS — N3001 Acute cystitis with hematuria: Secondary | ICD-10-CM | POA: Diagnosis not present

## 2020-07-01 NOTE — Progress Notes (Signed)
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 Coronary Artery Disease  . Hypertension  . 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  He is back exercising in the gym.  I was concerned about heart rate of 45 on the EKG.  He reminds me that I previously had a concern, stopped his low-dose beta-blocker, and he began having palpitations and heart rate increasing substantially with exercise that was uncomfortable.  We resumed Toprol-XL and he has had no issue.  He monitors his heart rate with exercise.  He gets his heart rate into the 100 range and then curtailed his exercise.  Past Medical History:  Diagnosis Date  . CAD (coronary artery disease)   . Coronary atherosclerosis of unspecified type of vessel, native or graft   . Diabetes ([place])   . Hypercholesteremia   . Type II or unspecified type diabetes mellitus without mention of complication, not stated as uncontrolled     Past Surgical History:  Procedure Laterality Date  . APPENDECTOMY    . benign pigmented nevus    . CABG Dr. Servando Snare  May 2010  . fractured left wrist    . TONSILLECTOMY      Current Medications: Current Meds  Medication Sig  . alfuzosin (UROXATRAL) 10 MG 24 hr tablet Take 1 tablet by mouth daily.  Marland Kitchen aspirin EC 81 MG tablet Take 1 tablet (81 mg total) by mouth daily.  .  finasteride (PROSCAR) 5 MG tablet Take 5 mg by mouth daily.  . metFORMIN (GLUCOPHAGE-XR) 500 MG 24 hr tablet Take 500 mg by mouth daily with breakfast.   . metoprolol succinate (TOPROL-XL) 25 MG 24 hr tablet Take 1 tablet (25 mg total) by mouth daily. Please keep upcoming appt in April 2022 with Dr. Tamala Julian before anymore refills. Thank you  . Multiple Vitamin (MULTIVITAMIN) capsule Take 1 capsule by mouth daily.  . sildenafil (VIAGRA) 50 MG tablet as needed.  Marland Kitchen VYTORIN 10-80 MG tablet Take 1 tablet by mouth daily.     Allergies:   Tetracyclines & related and Bactrim [sulfamethoxazole-trimethoprim]   Social History   Socioeconomic History  . Marital status: Married    Spouse name: Not on file  . Number of children: Not on file  . Years of education: Not on file  . Highest education level: Not on file  Occupational History  . Not on file  Tobacco Use  . Smoking status: Never Smoker  . Smokeless tobacco: Never Used  Substance and Sexual Activity  . Alcohol use: Yes    Alcohol/week: 8.0 standard drinks    Types: 4 Cans of beer, 4 Standard drinks or equivalent per week  . Drug use: No  . Sexual activity: Not on file  Other Topics Concern  . Not on file  Social History Narrative  . Not on  file   Social Determinants of Health   Financial Resource Strain: Not on file  Food Insecurity: Not on file  Transportation Needs: Not on file  Physical Activity: Not on file  Stress: Not on file  Social Connections: Not on file     Family History: The patient's family history includes Alzheimer's disease in his father; Breast cancer in his mother; CAD in his father; Diabetes in his mother; Healthy in his sister; Stroke in his father.  ROS:   Please see the history of present illness.    As reviewed and are negative except: He plans a trip to the [place] later this year.  He is not concerned about the physical activity required.  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  This will be a topic to her.   EKGs/Labs/Other Studies Reviewed:    The following studies were reviewed today: No new imaging  EKG:  EKG sinus bradycardia at 45 bpm, first-degree AV block 254 ms, QS pattern V1 and V2, when compared to July 2020, heart rate is slower.  Recent Labs: No results found for requested labs within last 8760 hours.  Recent Lipid Panel No results found for: CHOL, TRIG, HDL, CHOLHDL, VLDL, LDLCALC, LDLDIRECT  Physical Exam:    VS:  BP 128/62   Pulse (!) 45   Ht 5\' 7"  (1.702 m)   Wt 183 lb (83 kg)   SpO2 100%   BMI 28.66 kg/m     Wt Readings from Last 3 Encounters:  07/02/20 183 lb (83 kg)  06/03/19 191 lb 6.4 oz (86.8 kg)  10/01/18 175 lb (79.4 kg)     GEN: Slightly overweight. No acute distress HEENT: Normal NECK: No JVD. LYMPHATICS: No lymphadenopathy CARDIAC: No murmur. RRR no gallop, or edema. VASCULAR:  Normal Pulses. No bruits. RESPIRATORY:  Clear to auscultation without rales, wheezing or rhonchi  ABDOMEN: Soft, non-tender, non-distended, No pulsatile mass, MUSCULOSKELETAL: No deformity  SKIN: Warm and dry NEUROLOGIC:  Alert and oriented x 3 PSYCHIATRIC:  Normal affect   ASSESSMENT:    1. Coronary artery disease involving coronary bypass graft of native heart with angina pectoris ([place])   2. Essential hypertension   3. DM type 2 causing vascular disease ([place])   4. Hypercholesteremia   5. Sinus bradycardia   6. Educated about COVID-19 virus infection    PLAN:    In order of problems listed above:  1. Secondary prevention discussed.  We decided against decreasing the low-dose of metoprolol.  States that when he exercises not on metoprolol the heart rate gets very fast.  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  Another alternative would be to decrease Toprol to 12.5 mg/day.  He will monitor heart rates during and after exercise let  us know if there is any significant change over time. 2. Blood pressure control is excellent.  Low-salt diet is recommended. 3. Continue Metformin XR 500 mg daily 4. Continue Vytorin 10/80 mg daily 5. Close follow-up.  Also has first-degree AV block.  We have tried him on no beta-blocker therapy but he complains of palpitations and increased heart rates.  He is currently asymptomatic on 25 mg/day. 6. No issues with COVID-19.  Overall education and awareness concerning secondary risk prevention was discussed in detail: LDL less than 70, hemoglobin A1c less than 7, blood pressure target less than 130/80  mmHg, >150 minutes of moderate aerobic activity per week, avoidance of smoking, weight control (via diet and exercise), and continued surveillance/management of/for obstructive sleep apnea.    Medication Adjustments/Labs and Tests Ordered: Current medicines are reviewed at length with the patient today.  Concerns regarding medicines are outlined above.  Orders Placed This Encounter  Procedures  . EKG 12-Lead   No orders of the defined types were placed in this encounter.   Patient Instructions  Medication Instructions:  Your physician recommends that you continue on your current medications as directed. Please refer to the Current Medication list given to you today.  *If you need a refill on your cardiac medications before your next appointment, please call your pharmacy*   Lab Work: None If you have labs (blood work) drawn today and your tests are completely normal, you will receive your results only by: Marland Kitchen MyChart Message (if you have MyChart) OR . A paper copy in the mail If you have any lab test that is abnormal or we need to change your treatment, we will call you to review the results.   Testing/Procedures: None   Follow-Up: At [facility], you and your health needs are our priority.  As part of our continuing mission to provide you with exceptional heart care, we have  created designated Provider Care Teams.  These Care Teams include your primary Cardiologist (physician) and Advanced Practice Providers (APPs -  Physician Assistants and Nurse Practitioners) who all work together to provide you with the care you need, when you need it.  We recommend signing up for the patient portal called "MyChart".  Sign up information is provided on this After Visit Summary.  MyChart is used to connect with patients for Virtual Visits (Telemedicine).  Patients are able to view lab/test results, encounter notes, upcoming appointments, etc.  Non-urgent messages can be sent to your provider as well.   To learn more about what you can do with MyChart, go to [email or web address].    Your next appointment:   1 year(s)  The format for your next appointment:   In Person  Provider:   You may see Jonathan Grooms, MD or one of the following Advanced Practice Providers on your designated Care Team:    Kathyrn Drown, NP    Other Instructions      Signed, Jonathan Grooms, MD  07/02/2020 11:56 AM    [place]

## 2020-07-02 ENCOUNTER — Ambulatory Visit: Payer: Medicare Other | Admitting: Interventional Cardiology

## 2020-07-02 ENCOUNTER — Other Ambulatory Visit: Payer: Self-pay

## 2020-07-02 ENCOUNTER — Encounter: Payer: Self-pay | Admitting: Interventional Cardiology

## 2020-07-02 VITALS — BP 128/62 | HR 45 | Ht 67.0 in | Wt 183.0 lb

## 2020-07-02 DIAGNOSIS — I25709 Atherosclerosis of coronary artery bypass graft(s), unspecified, with unspecified angina pectoris: Secondary | ICD-10-CM

## 2020-07-02 DIAGNOSIS — E1159 Type 2 diabetes mellitus with other circulatory complications: Secondary | ICD-10-CM | POA: Diagnosis not present

## 2020-07-02 DIAGNOSIS — E7800 Pure hypercholesterolemia, unspecified: Secondary | ICD-10-CM | POA: Diagnosis not present

## 2020-07-02 DIAGNOSIS — Z7189 Other specified counseling: Secondary | ICD-10-CM

## 2020-07-02 DIAGNOSIS — R001 Bradycardia, unspecified: Secondary | ICD-10-CM

## 2020-07-02 DIAGNOSIS — I10 Essential (primary) hypertension: Secondary | ICD-10-CM | POA: Diagnosis not present

## 2020-07-02 NOTE — Patient Instructions (Signed)

## 2020-08-15 DIAGNOSIS — Z7984 Long term (current) use of oral hypoglycemic drugs: Secondary | ICD-10-CM | POA: Diagnosis not present

## 2020-08-15 DIAGNOSIS — E7800 Pure hypercholesterolemia, unspecified: Secondary | ICD-10-CM | POA: Diagnosis not present

## 2020-08-15 DIAGNOSIS — Z79899 Other long term (current) drug therapy: Secondary | ICD-10-CM | POA: Diagnosis not present

## 2020-08-15 DIAGNOSIS — I700 Atherosclerosis of aorta: Secondary | ICD-10-CM | POA: Diagnosis not present

## 2020-08-15 DIAGNOSIS — E222 Syndrome of inappropriate secretion of antidiuretic hormone: Secondary | ICD-10-CM | POA: Diagnosis not present

## 2020-08-15 DIAGNOSIS — E1169 Type 2 diabetes mellitus with other specified complication: Secondary | ICD-10-CM | POA: Diagnosis not present

## 2020-08-20 ENCOUNTER — Other Ambulatory Visit: Payer: Self-pay | Admitting: Interventional Cardiology

## 2020-11-12 DIAGNOSIS — M79672 Pain in left foot: Secondary | ICD-10-CM | POA: Diagnosis not present

## 2021-02-04 DIAGNOSIS — L814 Other melanin hyperpigmentation: Secondary | ICD-10-CM | POA: Diagnosis not present

## 2021-02-04 DIAGNOSIS — L570 Actinic keratosis: Secondary | ICD-10-CM | POA: Diagnosis not present

## 2021-02-04 DIAGNOSIS — D2271 Melanocytic nevi of right lower limb, including hip: Secondary | ICD-10-CM | POA: Diagnosis not present

## 2021-02-04 DIAGNOSIS — Z85828 Personal history of other malignant neoplasm of skin: Secondary | ICD-10-CM | POA: Diagnosis not present

## 2021-02-04 DIAGNOSIS — D2261 Melanocytic nevi of right upper limb, including shoulder: Secondary | ICD-10-CM | POA: Diagnosis not present

## 2021-02-04 DIAGNOSIS — Z85820 Personal history of malignant melanoma of skin: Secondary | ICD-10-CM | POA: Diagnosis not present

## 2021-02-04 DIAGNOSIS — K130 Diseases of lips: Secondary | ICD-10-CM | POA: Diagnosis not present

## 2021-02-04 DIAGNOSIS — D224 Melanocytic nevi of scalp and neck: Secondary | ICD-10-CM | POA: Diagnosis not present

## 2021-02-04 DIAGNOSIS — D1801 Hemangioma of skin and subcutaneous tissue: Secondary | ICD-10-CM | POA: Diagnosis not present

## 2021-02-04 DIAGNOSIS — D225 Melanocytic nevi of trunk: Secondary | ICD-10-CM | POA: Diagnosis not present

## 2021-02-04 DIAGNOSIS — D2272 Melanocytic nevi of left lower limb, including hip: Secondary | ICD-10-CM | POA: Diagnosis not present

## 2021-02-04 DIAGNOSIS — D2262 Melanocytic nevi of left upper limb, including shoulder: Secondary | ICD-10-CM | POA: Diagnosis not present

## 2021-02-25 DIAGNOSIS — E7800 Pure hypercholesterolemia, unspecified: Secondary | ICD-10-CM | POA: Diagnosis not present

## 2021-02-25 DIAGNOSIS — Z0000 Encounter for general adult medical examination without abnormal findings: Secondary | ICD-10-CM | POA: Diagnosis not present

## 2021-02-25 DIAGNOSIS — Z79899 Other long term (current) drug therapy: Secondary | ICD-10-CM | POA: Diagnosis not present

## 2021-02-25 DIAGNOSIS — I700 Atherosclerosis of aorta: Secondary | ICD-10-CM | POA: Diagnosis not present

## 2021-02-25 DIAGNOSIS — Z1389 Encounter for screening for other disorder: Secondary | ICD-10-CM | POA: Diagnosis not present

## 2021-02-25 DIAGNOSIS — E222 Syndrome of inappropriate secretion of antidiuretic hormone: Secondary | ICD-10-CM | POA: Diagnosis not present

## 2021-02-25 DIAGNOSIS — E1169 Type 2 diabetes mellitus with other specified complication: Secondary | ICD-10-CM | POA: Diagnosis not present

## 2021-03-08 DIAGNOSIS — D485 Neoplasm of uncertain behavior of skin: Secondary | ICD-10-CM | POA: Diagnosis not present

## 2021-03-08 DIAGNOSIS — D23122 Other benign neoplasm of skin of left lower eyelid, including canthus: Secondary | ICD-10-CM | POA: Diagnosis not present

## 2021-04-03 DIAGNOSIS — R3915 Urgency of urination: Secondary | ICD-10-CM | POA: Diagnosis not present

## 2021-04-22 DIAGNOSIS — K130 Diseases of lips: Secondary | ICD-10-CM | POA: Diagnosis not present

## 2021-05-01 DIAGNOSIS — E119 Type 2 diabetes mellitus without complications: Secondary | ICD-10-CM | POA: Diagnosis not present

## 2021-05-01 DIAGNOSIS — Z961 Presence of intraocular lens: Secondary | ICD-10-CM | POA: Diagnosis not present

## 2021-05-13 ENCOUNTER — Telehealth: Payer: Self-pay | Admitting: Interventional Cardiology

## 2021-05-13 DIAGNOSIS — I498 Other specified cardiac arrhythmias: Secondary | ICD-10-CM | POA: Diagnosis not present

## 2021-05-13 DIAGNOSIS — I499 Cardiac arrhythmia, unspecified: Secondary | ICD-10-CM | POA: Diagnosis not present

## 2021-05-13 NOTE — Telephone Encounter (Signed)
Patient wants Dr. Tamala Julian to call him concerning his Afib. Please Advise

## 2021-05-13 NOTE — Telephone Encounter (Signed)
Spoke with pt and he states that he played 4 games of pickle ball today.  After getting home he felt like his heart was pounding so he did an EKG on his Apple Watch and it showed AF.  Checked again on his FitBit and it showed the same.  Showed this until about 2:20pm and then converted back to NSR.  States he tried to call our office and could not get through so he called his PCP, Dr Felipa Eth.  They did an EKG that showed sinus brady so he ordered a 2 week monitor.  Advised I will let Dr. Tamala Julian know and to have Dr. Carlyle Lipa office send [place] the results once available.  Will update Dr. Tamala Julian.

## 2021-05-14 DIAGNOSIS — I498 Other specified cardiac arrhythmias: Secondary | ICD-10-CM | POA: Diagnosis not present

## 2021-05-16 ENCOUNTER — Encounter: Payer: Self-pay | Admitting: Interventional Cardiology

## 2021-05-16 NOTE — Telephone Encounter (Signed)
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  He had just gotten back from a walk and states he registered AF while out and by the time he got back and was registering NSR again.  Advised I have sent EKG to Dr. Tamala Julian for review.   Called PCP office and requested EKG done there this week be faxed to our office.

## 2021-05-21 ENCOUNTER — Encounter: Payer: Self-pay | Admitting: Interventional Cardiology

## 2021-06-03 DIAGNOSIS — I498 Other specified cardiac arrhythmias: Secondary | ICD-10-CM | POA: Diagnosis not present

## 2021-06-04 DIAGNOSIS — I498 Other specified cardiac arrhythmias: Secondary | ICD-10-CM | POA: Diagnosis not present

## 2021-06-04 NOTE — Telephone Encounter (Signed)
ZIO XT monitor report based upon my interpretation of the recent 14-day study performed at [facility] by Dr. Felipa Eth on 05/14/2021 ?Heart rate range 21 to 145 bpm ?Atrial fibrillation/flutter with postconversion 3.1-second pause not associated with symptoms. ?When in atrial fibrillation/flutter rates ranged between 35 and 134 bpm.  Longest episode of A-fib/flutter 3 hours 53 minutes at an average rate of 63 bpm.  Atrial fibs/flutter burden 3%. ?Sinus bradycardia noted, PACs noted, occasional type II AV block noted ?No significant ventricular arrhythmias. ?

## 2021-06-20 ENCOUNTER — Encounter: Payer: Self-pay | Admitting: Interventional Cardiology

## 2021-08-09 ENCOUNTER — Other Ambulatory Visit: Payer: Self-pay | Admitting: Interventional Cardiology

## 2021-08-11 DIAGNOSIS — R6883 Chills (without fever): Secondary | ICD-10-CM | POA: Diagnosis not present

## 2021-08-11 DIAGNOSIS — Z03818 Encounter for observation for suspected exposure to other biological agents ruled out: Secondary | ICD-10-CM | POA: Diagnosis not present

## 2021-08-11 DIAGNOSIS — R051 Acute cough: Secondary | ICD-10-CM | POA: Diagnosis not present

## 2021-08-11 DIAGNOSIS — R5383 Other fatigue: Secondary | ICD-10-CM | POA: Diagnosis not present

## 2021-08-11 DIAGNOSIS — J029 Acute pharyngitis, unspecified: Secondary | ICD-10-CM | POA: Diagnosis not present

## 2021-08-11 DIAGNOSIS — B349 Viral infection, unspecified: Secondary | ICD-10-CM | POA: Diagnosis not present

## 2021-08-20 NOTE — Progress Notes (Unsigned)
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   [name] is doing well.  He was started on apixaban because of a monitor that demonstrated 3% atrial flutter burden.  Heart rate ranged between 21 and 145 bpm.  On apixaban, no issues have arisen.  He has not had falls, syncope, or near syncope.  He is very active.  He plays pickle ball.  He is on low-dose beta-blocker therapy which perhaps that should discontinue.   Past Medical History:  Diagnosis Date   CAD (coronary artery disease)    Coronary atherosclerosis of unspecified type of vessel, native or graft    Diabetes ([place])    Hypercholesteremia    Type II or unspecified type diabetes mellitus without mention of complication, not stated as uncontrolled     Past Surgical History:  Procedure Laterality Date   APPENDECTOMY     benign pigmented nevus     CABG Dr. Servando Snare  May 2010   fractured left wrist     TONSILLECTOMY      Current Medications: Current Meds  Medication Sig   alfuzosin (UROXATRAL) 10 MG 24 hr tablet Take 1 tablet by mouth daily.   ELIQUIS 5 MG TABS tablet Take 5 mg by mouth 2 (two) times daily.   finasteride (PROSCAR) 5 MG tablet Take 5 mg by mouth daily.   metFORMIN (GLUCOPHAGE-XR) 500 MG 24 hr tablet Take 500 mg by mouth daily with breakfast.    metoprolol succinate (TOPROL-XL) 25 MG 24 hr tablet Take 1 tablet (25 mg total) by  mouth daily.   Multiple Vitamin (MULTIVITAMIN) capsule Take 1 capsule by mouth daily.   sildenafil (VIAGRA) 50 MG tablet Take 20 mg by mouth daily as needed for erectile dysfunction.   VYTORIN 10-80 MG tablet Take 1 tablet by mouth daily.     Allergies:   Tetracyclines & related and Bactrim [sulfamethoxazole-trimethoprim]   Social History   Socioeconomic History   Marital status: Married    Spouse name: Not on file   Number of children: Not on file   Years of education: Not on file   Highest education level: Not on file  Occupational History   Not on file  Tobacco Use   Smoking status: Never   Smokeless tobacco: Never  Substance and Sexual Activity   Alcohol use: Yes    Alcohol/week: 8.0 standard drinks    Types: 4 Cans of beer, 4 Standard drinks or equivalent per week   Drug use: No   Sexual activity: Not on file  Other Topics Concern   Not on file  Social History Narrative   Not on file   Social Determinants of Health   Financial Resource Strain: Not on file  Food Insecurity: Not on file  Transportation Needs: Not on file  Physical Activity: Not on file  Stress: Not on file  Social Connections: Not on file  Family History: The patient's family history includes Alzheimer's disease in his father; Breast cancer in his mother; CAD in his father; Diabetes in his mother; Healthy in his sister; Stroke in his father.  ROS:   Please see the history of present illness.    Easy bruising.  Aspirin has been discontinued.  All other systems reviewed and are negative.  EKGs/Labs/Other Studies Reviewed:    The following studies were reviewed today: 13 DAY ZIO MONITOR : 3% A flutter burden Predominant rhythm NSR with range 21-145 bpm, ave HR 53 bpm. 3.1 second pause post conversion A. Flutter with ave HR 62 bpm and range 34-134 bpm. Low burden PVC.  EKG: Atrial flutter with average ventricular rate 48 bpm.  Otherwise no change.  QS V1 and V2.  Recent Labs: No results  found for requested labs within last 8760 hours.  Recent Lipid Panel No results found for: CHOL, TRIG, HDL, CHOLHDL, VLDL, LDLCALC, LDLDIRECT  Physical Exam:    VS:  BP (!) 112/58   Pulse (!) 58   Ht '5\' 7"'$  (1.702 m)   Wt 182 lb 12.8 oz (82.9 kg)   SpO2 98%   BMI 28.63 kg/m     Wt Readings from Last 3 Encounters:  08/22/21 182 lb 12.8 oz (82.9 kg)  07/02/20 183 lb (83 kg)  06/03/19 191 lb 6.4 oz (86.8 kg)     GEN: Appears younger than stated age.. No acute distress HEENT: Normal NECK: No JVD. LYMPHATICS: No lymphadenopathy CARDIAC: No murmur.  Slow and irregularly irregular no gallop, or edema. VASCULAR:  Normal Pulses. No bruits. RESPIRATORY:  Clear to auscultation without rales, wheezing or rhonchi  ABDOMEN: Soft, non-tender, non-distended, No pulsatile mass, MUSCULOSKELETAL: No deformity  SKIN: Warm and dry NEUROLOGIC:  Alert and oriented x 3 PSYCHIATRIC:  Normal affect   ASSESSMENT:    1. Coronary artery disease involving coronary bypass graft of native heart with angina pectoris (HCC)   2. Paroxysmal atrial fibrillation ([place])   3. Essential hypertension   4. Hypercholesteremia   5. DM type 2 causing vascular disease ([place])   6. Chronic anticoagulation    PLAN:    In order of problems listed above:  Secondary prevention reviewed Paroxysmal atrial flutter, relatively low burden on monitor.  Continue anticoagulation.  EP consultation to continue treatment alternatives or continued status quo management.  Considerations would be pacing because of significant bradycardia although he is asymptomatic.  I would not feel comfortable starting antiarrhythmic therapy given the baseline bradycardia when he is in sinus rhythm.?  Ablation to resolve the atrial fibs/flutter.  Will refer to Dr. Curt Bears. Excellent blood pressure.  If we have to we can discontinue metoprolol. Continue Vytorin with aggressive LDL lowering to less than 70.  He has a LIMA to LAD. Hemoglobin A1c target  is less than 7.  Most recent in November was 7.2.  Consider SGLT2 therapy. Continue apixaban.  Monitor for bleeding.   85-monthfollow-up with me no matter what.  ECG today.  Electrophysiology consultation with Dr. CCurt Bearsto give guidance relative to management of relatively low burden atrial flutter/fib in the setting of tachybradycardia syndrome.   Medication Adjustments/Labs and Tests Ordered: Current medicines are reviewed at length with the patient today.  Concerns regarding medicines are outlined above.  Orders Placed This Encounter  Procedures   Ambulatory referral to Cardiac Electrophysiology   EKG 12-Lead   No orders of the defined types were placed in this encounter.   Patient Instructions  Medication Instructions:  Your physician recommends that you continue on your current medications as directed. Please refer to the Current Medication list given to you today.  *If you need a refill on your cardiac medications before your next appointment, please call your pharmacy*  Lab Work: NONE  Testing/Procedures: NONE  Follow-Up: At [facility], you and your health needs are our priority.  As part of our continuing mission to provide you with exceptional heart care, we have created designated Provider Care Teams.  These Care Teams include your primary Cardiologist (physician) and Advanced Practice Providers (APPs -  Physician Assistants and Nurse Practitioners) who all work together to provide you with the care you need, when you need it.  Your next appointment:   6 month(s)  The format for your next appointment:   In Person  Provider:   [name], MD {  Other Instructions Your physician has referred you to one of our Electrophysiology providers: Dr. Allegra Lai for evaluation and treatment of atrial fibrillation.  Important Information About Sugar         Signed, [name], MD  08/22/2021 12:45 PM    [facility]

## 2021-08-22 ENCOUNTER — Ambulatory Visit: Payer: Medicare Other | Admitting: Interventional Cardiology

## 2021-08-22 ENCOUNTER — Encounter: Payer: Self-pay | Admitting: Interventional Cardiology

## 2021-08-22 VITALS — BP 112/58 | HR 58 | Ht 67.0 in | Wt 182.8 lb

## 2021-08-22 DIAGNOSIS — Z7901 Long term (current) use of anticoagulants: Secondary | ICD-10-CM

## 2021-08-22 DIAGNOSIS — I10 Essential (primary) hypertension: Secondary | ICD-10-CM | POA: Diagnosis not present

## 2021-08-22 DIAGNOSIS — I480 Paroxysmal atrial fibrillation: Secondary | ICD-10-CM | POA: Diagnosis not present

## 2021-08-22 DIAGNOSIS — I25709 Atherosclerosis of coronary artery bypass graft(s), unspecified, with unspecified angina pectoris: Secondary | ICD-10-CM

## 2021-08-22 DIAGNOSIS — E7800 Pure hypercholesterolemia, unspecified: Secondary | ICD-10-CM

## 2021-08-22 DIAGNOSIS — E1159 Type 2 diabetes mellitus with other circulatory complications: Secondary | ICD-10-CM

## 2021-08-22 NOTE — Patient Instructions (Addendum)
Medication Instructions:  Your physician recommends that you continue on your current medications as directed. Please refer to the Current Medication list given to you today.  *If you need a refill on your cardiac medications before your next appointment, please call your pharmacy*  Lab Work: NONE  Testing/Procedures: NONE  Follow-Up: At [facility], you and your health needs are our priority.  As part of our continuing mission to provide you with exceptional heart care, we have created designated Provider Care Teams.  These Care Teams include your primary Cardiologist (physician) and Advanced Practice Providers (APPs -  Physician Assistants and Nurse Practitioners) who all work together to provide you with the care you need, when you need it.  Your next appointment:   6 month(s)  The format for your next appointment:   In Person  Provider:   [name], MD {  Other Instructions Your physician has referred you to one of our Electrophysiology providers: Dr. Allegra Lai for evaluation and treatment of atrial fibrillation.  Important Information About Sugar

## 2021-08-27 DIAGNOSIS — E7800 Pure hypercholesterolemia, unspecified: Secondary | ICD-10-CM | POA: Diagnosis not present

## 2021-08-27 DIAGNOSIS — E222 Syndrome of inappropriate secretion of antidiuretic hormone: Secondary | ICD-10-CM | POA: Diagnosis not present

## 2021-08-27 DIAGNOSIS — I483 Typical atrial flutter: Secondary | ICD-10-CM | POA: Diagnosis not present

## 2021-08-27 DIAGNOSIS — E1169 Type 2 diabetes mellitus with other specified complication: Secondary | ICD-10-CM | POA: Diagnosis not present

## 2021-08-27 DIAGNOSIS — I700 Atherosclerosis of aorta: Secondary | ICD-10-CM | POA: Diagnosis not present

## 2021-09-25 DIAGNOSIS — C44622 Squamous cell carcinoma of skin of right upper limb, including shoulder: Secondary | ICD-10-CM | POA: Diagnosis not present

## 2021-09-25 DIAGNOSIS — C44729 Squamous cell carcinoma of skin of left lower limb, including hip: Secondary | ICD-10-CM | POA: Diagnosis not present

## 2021-10-09 ENCOUNTER — Encounter: Payer: Self-pay | Admitting: Cardiology

## 2021-10-09 ENCOUNTER — Ambulatory Visit: Payer: Medicare Other | Admitting: Cardiology

## 2021-10-09 ENCOUNTER — Encounter: Payer: Self-pay | Admitting: *Deleted

## 2021-10-09 VITALS — BP 118/64 | HR 32 | Ht 67.0 in | Wt 184.2 lb

## 2021-10-09 DIAGNOSIS — D6869 Other thrombophilia: Secondary | ICD-10-CM

## 2021-10-09 DIAGNOSIS — I480 Paroxysmal atrial fibrillation: Secondary | ICD-10-CM | POA: Diagnosis not present

## 2021-10-09 NOTE — Patient Instructions (Addendum)
Medication Instructions:  Your physician recommends that you continue on your current medications as directed. Please refer to the Current Medication list given to you today.  *If you need a refill on your cardiac medications before your next appointment, please call your pharmacy*   Lab Work: None ordered   Testing/Procedures: None ordered   Follow-Up: At [facility], you and your health needs are our priority.  As part of our continuing mission to provide you with exceptional heart care, we have created designated Provider Care Teams.  These Care Teams include your primary Cardiologist (physician) and Advanced Practice Providers (APPs -  Physician Assistants and Nurse Practitioners) who all work together to provide you with the care you need, when you need it.  Your next appointment:   6 month(s)  The format for your next appointment:   In Person  Provider:   Will Camnitz, MD    Thank you for choosing [facility]!!   Jancy Sprankle, RN [phone]  Other Instructions   Important Information About Sugar           

## 2021-10-09 NOTE — Progress Notes (Signed)
Electrophysiology Office Note   Date:  10/09/2021   ID:  Jonathan Lark, [name], DOB 01-18-1941, MRN [id number]  PCP:  [name], [name]  Cardiologist:  Tamala Julian Primary Electrophysiologist:  Abou Sterkel Meredith Leeds, [name]    Chief Complaint: AF   History of Present Illness: Jonathan Lark, [name] is a 81 y.o. male who is being seen today for the evaluation of AF at the request of Belva Crome, [name]. Presenting today for electrophysiology evaluation.  He has a history significant for paroxysmal atrial fibrillation, coronary artery disease status post CABG in 2010 with LIMA to the LAD, out of hospital cardiac arrest with successful resuscitation, hypertension, diabetes.  He wore a cardiac monitor that showed a 3% atrial fibrillation burden.  He was started on Eliquis.  Today, he denies symptoms of palpitations, chest pain, shortness of breath, orthopnea, PND, lower extremity edema, claudication, dizziness, presyncope, syncope, bleeding, or neurologic sequela. The patient is tolerating medications without difficulties.  He is currently feeling well.  He has no chest pain or shortness of breath.  He is able to do most of the daily activities that he wants.  His wife does state that he has some exercise intolerance.  He plays pickle ball, goes to the gym, and walks for exercise.   Past Medical History:  Diagnosis Date   CAD (coronary artery disease)    Coronary atherosclerosis of unspecified type of vessel, native or graft    Diabetes ([place])    Hypercholesteremia    Type II or unspecified type diabetes mellitus without mention of complication, not stated as uncontrolled    Past Surgical History:  Procedure Laterality Date   APPENDECTOMY     benign pigmented nevus     CABG Dr. Servando Snare  May 2010   fractured left wrist     TONSILLECTOMY       Current Outpatient Medications  Medication Sig Dispense Refill   alfuzosin (UROXATRAL) 10 MG 24 hr tablet Take 1 tablet by mouth daily.     ELIQUIS  5 MG TABS tablet Take 5 mg by mouth 2 (two) times daily.     finasteride (PROSCAR) 5 MG tablet Take 5 mg by mouth daily.     metFORMIN (GLUCOPHAGE-XR) 500 MG 24 hr tablet Take 500 mg by mouth daily with breakfast.      metoprolol succinate (TOPROL-XL) 25 MG 24 hr tablet Take 1 tablet (25 mg total) by mouth daily. 90 tablet 0   Multiple Vitamin (MULTIVITAMIN) capsule Take 1 capsule by mouth daily. 3-5 tabs once weekly     sildenafil (REVATIO) 20 MG tablet 4 tablets     sildenafil (VIAGRA) 50 MG tablet Take 20 mg by mouth daily as needed for erectile dysfunction.     VYTORIN 10-80 MG tablet Take 1 tablet by mouth daily.     No current facility-administered medications for this visit.    Allergies:   Tetracyclines & related and Bactrim [sulfamethoxazole-trimethoprim]   Social History:  The patient  reports that he has never smoked. He has never used smokeless tobacco. He reports current alcohol use of about 8.0 standard drinks of alcohol per week. He reports that he does not use drugs.   Family History:  The patient's family history includes Alzheimer's disease in his father; Breast cancer in his mother; CAD in his father; Diabetes in his mother; Healthy in his sister; Stroke in his father.    ROS:  Please see the history of present illness.   Otherwise, review of  systems is positive for none.   All other systems are reviewed and negative.    PHYSICAL EXAM: VS:  BP 118/64   Pulse (!) 32   Ht '5\' 7"'$  (1.702 m)   Wt 184 lb 3.2 oz (83.6 kg)   BMI 28.85 kg/m  , BMI Body mass index is 28.85 kg/m. GEN: Well nourished, well developed, in no acute distress  HEENT: normal  Neck: no JVD, carotid bruits, or masses Cardiac: Bradycardic, regular; no murmurs, rubs, or gallops,no edema  Respiratory:  clear to auscultation bilaterally, normal work of breathing GI: soft, nontender, nondistended, + BS MS: no deformity or atrophy  Skin: warm and dry Neuro:  Strength and sensation are intact Psych:  euthymic mood, full affect  EKG:  EKG is ordered today. Personal review of the ekg ordered shows sinus rhythm, nonconducted PACs, rate 32  Recent Labs: No results found for requested labs within last 365 days.    Lipid Panel  No results found for: "CHOL", "TRIG", "HDL", "CHOLHDL", "VLDL", "LDLCALC", "LDLDIRECT"   Wt Readings from Last 3 Encounters:  10/09/21 184 lb 3.2 oz (83.6 kg)  08/22/21 182 lb 12.8 oz (82.9 kg)  07/02/20 183 lb (83 kg)      Other studies Reviewed: Additional studies/ records that were reviewed today include: Cardiac monitor 06/25/2021 personally reviewed Review of the above records today demonstrates: Predominant rhythm was sinus rhythm 3% atrial fibrillation/flutter burden   ASSESSMENT AND PLAN:  1.  Paroxysmal atrial fibrillation: Found to have a 3% burden on his cardiac monitor.  Currently on Eliquis 5 mg twice daily.  CHA2DS2-VASc of at least 5.  He has minimal symptoms associated with his atrial fibrillation.  He currently feels well.  He would like to hold off on further therapy for his atrial fibrillation for now, but if he does develop symptoms in the future or episodes become more persistent, he would be open to ablation.  2.  Coronary artery disease: Status post CABG.  Plan per primary cardiology.  3.  Hypertension: Currently well controlled  4.  Hyperlipidemia: Continue Vytorin per primary cardiology  5.  Secondary hypercoagulable state: Currently on Eliquis for atrial fibrillation as above  6.  PACs: Causing his bradycardia.  He is asymptomatic today.  At this point, would hold off on further rate controlling medications as he is quite bradycardic.  If he develops symptoms, pacemaker implant would be indicated.  Case discussed with primary cardiology  Current medicines are reviewed at length with the patient today.   The patient does not have concerns regarding his medicines.  The following changes were made today:  none  Labs/ tests  ordered today include:  Orders Placed This Encounter  Procedures   EKG 12-Lead     Disposition:   FU with Gilmar Bua 6 months  Signed, Adda Stokes Meredith Leeds, [name]  10/09/2021 10:37 AM     [facility] [address] [place] [address] [phone] (office) [phone] (fax)

## 2021-11-06 ENCOUNTER — Other Ambulatory Visit: Payer: Self-pay | Admitting: Interventional Cardiology

## 2022-01-23 DIAGNOSIS — H26493 Other secondary cataract, bilateral: Secondary | ICD-10-CM | POA: Diagnosis not present

## 2022-01-24 DIAGNOSIS — H26491 Other secondary cataract, right eye: Secondary | ICD-10-CM | POA: Diagnosis not present

## 2022-02-10 DIAGNOSIS — D2262 Melanocytic nevi of left upper limb, including shoulder: Secondary | ICD-10-CM | POA: Diagnosis not present

## 2022-02-10 DIAGNOSIS — D2261 Melanocytic nevi of right upper limb, including shoulder: Secondary | ICD-10-CM | POA: Diagnosis not present

## 2022-02-10 DIAGNOSIS — L814 Other melanin hyperpigmentation: Secondary | ICD-10-CM | POA: Diagnosis not present

## 2022-02-10 DIAGNOSIS — L570 Actinic keratosis: Secondary | ICD-10-CM | POA: Diagnosis not present

## 2022-02-10 DIAGNOSIS — D2272 Melanocytic nevi of left lower limb, including hip: Secondary | ICD-10-CM | POA: Diagnosis not present

## 2022-02-10 DIAGNOSIS — Z85820 Personal history of malignant melanoma of skin: Secondary | ICD-10-CM | POA: Diagnosis not present

## 2022-02-10 DIAGNOSIS — Z85828 Personal history of other malignant neoplasm of skin: Secondary | ICD-10-CM | POA: Diagnosis not present

## 2022-02-10 DIAGNOSIS — D1801 Hemangioma of skin and subcutaneous tissue: Secondary | ICD-10-CM | POA: Diagnosis not present

## 2022-02-10 DIAGNOSIS — D225 Melanocytic nevi of trunk: Secondary | ICD-10-CM | POA: Diagnosis not present

## 2022-02-10 DIAGNOSIS — D2271 Melanocytic nevi of right lower limb, including hip: Secondary | ICD-10-CM | POA: Diagnosis not present

## 2022-02-10 DIAGNOSIS — L821 Other seborrheic keratosis: Secondary | ICD-10-CM | POA: Diagnosis not present

## 2022-02-25 NOTE — Progress Notes (Unsigned)
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 male with a hx of PAF, chronic anticoagulation with Eliquis, CAD, status post CABG 2010 with LIMA to LAD, history of out-of-hospital ventricular fibrillation cardiac arrest with successful resuscitation, essential hypertension, and diabetes mellitus.  Being seen by EP, Dr. Curt Bears.  Currently in watchful waiting mode.  There are no cardiac complaints.  Denies orthopnea, PND, and syncope.  We discussed significant bradycardia however it is asymptomatic and we are in a mode of watchful waiting relative to potential pacemaker therapy and or ablation.  A-fib burden is low.  We did discuss the long-term negative impact of high burden or continuous atrial fibrillation relative to atrial myopathy and valve failure.  At his current age, concerned about atrial myopathy should he develop permanent atrial fibrillation may not be relevant.  Past Medical History:  Diagnosis Date   CAD (coronary artery disease)    Coronary atherosclerosis of unspecified type of vessel, native or graft    Diabetes ([place])    Hypercholesteremia    Type II or unspecified type diabetes mellitus without mention of complication, not stated as uncontrolled     Past Surgical History:  Procedure Laterality Date   APPENDECTOMY     benign pigmented nevus     CABG Dr. Servando Snare  May 2010   fractured left wrist     TONSILLECTOMY      Current Medications: Current Meds  Medication Sig   alfuzosin (UROXATRAL) 10 MG 24 hr tablet Take 1 tablet by mouth daily.   ELIQUIS 5 MG TABS tablet Take 5 mg by mouth 2 (two) times daily.   finasteride (PROSCAR) 5 MG tablet  Take 5 mg by mouth daily.   metFORMIN (GLUCOPHAGE-XR) 500 MG 24 hr tablet Take 500 mg by mouth daily with breakfast.    metoprolol succinate (TOPROL-XL) 25 MG 24 hr tablet Take 1 tablet (25 mg total) by mouth daily.   Multiple Vitamin (MULTIVITAMIN) capsule Take 1 capsule by mouth daily. 3-5 tabs once weekly   sildenafil (REVATIO) 20 MG tablet Take 20 mg by mouth as needed.   VYTORIN 10-80 MG tablet Take 1 tablet by mouth daily.     Allergies:   Tetracyclines & related and Bactrim [sulfamethoxazole-trimethoprim]   Social History   Socioeconomic History   Marital status: Married    Spouse name: Not on file   Number of children: Not on file   Years of education: Not on file   Highest education level: Not on file  Occupational History   Not on file  Tobacco Use   Smoking status: Never   Smokeless tobacco: Never  Substance and Sexual Activity   Alcohol use: Yes    Alcohol/week: 8.0 standard drinks of alcohol    Types: 4 Cans of beer, 4 Standard drinks or equivalent per week   Drug use: No   Sexual activity: Not on file  Other Topics Concern   Not on file  Social History Narrative   Not on file   Social Determinants of Health   Financial Resource Strain: Not on file  Food Insecurity: Not on  file  Transportation Needs: Not on file  Physical Activity: Not on file  Stress: Not on file  Social Connections: Not on file     Family History: The patient's family history includes Alzheimer's disease in his father; Breast cancer in his mother; CAD in his father; Diabetes in his mother; Healthy in his sister; Stroke in his father.  ROS:   Please see the history of present illness.    Insomnia PND.  Remains active.  Mentally sharp.  All other systems reviewed and are negative.  EKGs/Labs/Other Studies Reviewed:    The following studies were reviewed today: No new imaging  Please see the monitor performed by Dr. Felipa Eth in the records.  There was 3% burden of atrial  fibrillation/flutter noted.  Anticoagulation therapy was started.  Study was completed in March 2023.  Heart rate range 46 to 115 bpm.  When in atrial fibrillation heart rate ranged between 60 and 90 bpm.  EKG:  EKG not repeated today.  Recent Labs: No results found for requested labs within last 365 days.  Recent Lipid Panel No results found for: "CHOL", "TRIG", "HDL", "CHOLHDL", "VLDL", "LDLCALC", "LDLDIRECT"  Physical Exam:    VS:  BP 112/72   Pulse (!) 44   Ht '5\' 7"'$  (1.702 m)   Wt 186 lb 6.4 oz (84.6 kg)   SpO2 99%   BMI 29.19 kg/m     Wt Readings from Last 3 Encounters:  02/26/22 186 lb 6.4 oz (84.6 kg)  10/09/21 184 lb 3.2 oz (83.6 kg)  08/22/21 182 lb 12.8 oz (82.9 kg)     GEN: Compatible with age. No acute distress HEENT: Normal NECK: No JVD. LYMPHATICS: No lymphadenopathy CARDIAC: No murmur. IRR no gallop, or edema. VASCULAR:  Normal Pulses. No bruits. RESPIRATORY:  Clear to auscultation without rales, wheezing or rhonchi  ABDOMEN: Soft, non-tender, non-distended, No pulsatile mass, MUSCULOSKELETAL: No deformity  SKIN: Warm and dry NEUROLOGIC:  Alert and oriented x 3 PSYCHIATRIC:  Normal affect   ASSESSMENT:    1. Paroxysmal atrial fibrillation (HCC)   2. Chronic anticoagulation   3. Coronary artery disease involving coronary bypass graft of native heart with angina pectoris ([place])   4. Essential hypertension   5. Hypercholesteremia   6. DM type 2 causing vascular disease ([place])    PLAN:    In order of problems listed above:  Continue anticoagulation therapy.  Keeping options for ablation on the table.  Low burden A-fib now and asymptomatic therefore no movement towards ablation at his age.  Continue very low-dose beta-blocker therapy. Continue Eliquis therapy. Secondary risk prevention.  LDL target should remain less than 70. Current blood pressure is excellent on current activity, salt restriction, and low-dose metoprolol succinate. Continue  ezetimibe/simvastatin 10/80 mg/day. Consider SGLT2 therapy   Request that Dr. Angelena Form become his general cardiologist going forward since he has coronary disease.  He will continue to follow-up with Dr. Curt Bears relative to A-fib and bradycardia.   Medication Adjustments/Labs and Tests Ordered: Current medicines are reviewed at length with the patient today.  Concerns regarding medicines are outlined above.  No orders of the defined types were placed in this encounter.  No orders of the defined types were placed in this encounter.   Patient Instructions  Medication Instructions:  Your physician recommends that you continue on your current medications as directed. Please refer to the Current Medication list given to you today.  *If you need a refill on your cardiac medications before your next appointment, please  call your pharmacy*  Follow-Up: At [facility], you and your health needs are our priority.  As part of our continuing mission to provide you with exceptional heart care, we have created designated Provider Care Teams.  These Care Teams include your primary Cardiologist (physician) and Advanced Practice Providers (APPs -  Physician Assistants and Nurse Practitioners) who all work together to provide you with the care you need, when you need it.  Your next appointment:   1 year(s)  The format for your next appointment:   In Person  Provider:   Lauree Chandler, [name]  Important Information About Sugar         Signed, [name], [name]  02/26/2022 9:44 AM    [place]

## 2022-02-26 ENCOUNTER — Ambulatory Visit: Payer: Medicare Other | Attending: Interventional Cardiology | Admitting: Interventional Cardiology

## 2022-02-26 ENCOUNTER — Encounter: Payer: Self-pay | Admitting: Interventional Cardiology

## 2022-02-26 VITALS — BP 112/72 | HR 44 | Ht 67.0 in | Wt 186.4 lb

## 2022-02-26 DIAGNOSIS — I10 Essential (primary) hypertension: Secondary | ICD-10-CM

## 2022-02-26 DIAGNOSIS — E7800 Pure hypercholesterolemia, unspecified: Secondary | ICD-10-CM

## 2022-02-26 DIAGNOSIS — I25709 Atherosclerosis of coronary artery bypass graft(s), unspecified, with unspecified angina pectoris: Secondary | ICD-10-CM

## 2022-02-26 DIAGNOSIS — I480 Paroxysmal atrial fibrillation: Secondary | ICD-10-CM

## 2022-02-26 DIAGNOSIS — E1159 Type 2 diabetes mellitus with other circulatory complications: Secondary | ICD-10-CM | POA: Diagnosis not present

## 2022-02-26 DIAGNOSIS — Z7901 Long term (current) use of anticoagulants: Secondary | ICD-10-CM

## 2022-02-26 NOTE — Patient Instructions (Signed)
Medication Instructions:  Your physician recommends that you continue on your current medications as directed. Please refer to the Current Medication list given to you today.  *If you need a refill on your cardiac medications before your next appointment, please call your pharmacy*  Follow-Up: At [facility], you and your health needs are our priority.  As part of our continuing mission to provide you with exceptional heart care, we have created designated Provider Care Teams.  These Care Teams include your primary Cardiologist (physician) and Advanced Practice Providers (APPs -  Physician Assistants and Nurse Practitioners) who all work together to provide you with the care you need, when you need it.  Your next appointment:   1 year(s)  The format for your next appointment:   In Person  Provider:   Lauree Chandler, MD  Important Information About Sugar

## 2022-03-09 ENCOUNTER — Other Ambulatory Visit: Payer: Self-pay | Admitting: Interventional Cardiology

## 2022-04-22 ENCOUNTER — Ambulatory Visit: Payer: Medicare Other | Attending: Cardiology | Admitting: Cardiology

## 2022-04-22 ENCOUNTER — Encounter: Payer: Self-pay | Admitting: Cardiology

## 2022-04-22 VITALS — BP 106/62 | HR 63 | Ht 67.0 in | Wt 180.4 lb

## 2022-04-22 DIAGNOSIS — I10 Essential (primary) hypertension: Secondary | ICD-10-CM

## 2022-04-22 DIAGNOSIS — I480 Paroxysmal atrial fibrillation: Secondary | ICD-10-CM

## 2022-04-22 DIAGNOSIS — I2510 Atherosclerotic heart disease of native coronary artery without angina pectoris: Secondary | ICD-10-CM | POA: Diagnosis not present

## 2022-04-22 DIAGNOSIS — D6869 Other thrombophilia: Secondary | ICD-10-CM | POA: Diagnosis not present

## 2022-04-22 NOTE — Progress Notes (Signed)
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 Presenting today for electrophysiology evaluation.  He has a history significant for paroxysmal atrial fibrillation, coronary artery disease post CABG in 2010 with LIMA to the LAD, out of hospital cardiac arrest with successful resuscitation, hypertension, diabetes.  He wore a cardiac monitor that showed a 3% atrial fibrillation burden he is now on Eliquis.  Today, denies symptoms of palpitations, chest pain, shortness of breath, orthopnea, PND, lower extremity edema, claudication, dizziness, presyncope, syncope, bleeding, or neurologic sequela. The patient is tolerating medications without difficulties.  He is currently feeling well.  He has no chest pain or shortness of breath.  He is able to do all of his daily activities.  He continues to exercise, playing pickle ball among other things.    Past Medical History:  Diagnosis Date   CAD (coronary artery disease)    Coronary atherosclerosis of unspecified type of vessel, native or graft    Diabetes ([place])    Hypercholesteremia    Type II or unspecified type diabetes mellitus without mention of complication, not stated as uncontrolled    Past Surgical History:  Procedure Laterality Date   APPENDECTOMY     benign pigmented nevus     CABG Dr. Servando Snare  May 2010   fractured left wrist     TONSILLECTOMY       Current Outpatient Medications  Medication Sig Dispense Refill   alfuzosin (UROXATRAL) 10 MG 24 hr tablet Take 1 tablet by mouth daily.     ELIQUIS 5 MG TABS tablet Take 5 mg by mouth 2 (two) times daily.     finasteride (PROSCAR) 5  MG tablet Take 5 mg by mouth daily.     metFORMIN (GLUCOPHAGE-XR) 500 MG 24 hr tablet Take 500 mg by mouth daily with breakfast.      metoprolol succinate (TOPROL-XL) 25 MG 24 hr tablet Take 1 tablet (25 mg total) by mouth daily. 90 tablet 2   Multiple Vitamin (MULTIVITAMIN) capsule Take 1 capsule by mouth daily. 3-5 tabs once weekly     sildenafil (REVATIO) 20 MG tablet Take 20 mg by mouth as needed.     VYTORIN 10-80 MG tablet Take 1 tablet by mouth daily.     No current facility-administered medications for this visit.    Allergies:   Tetracyclines & related and Bactrim [sulfamethoxazole-trimethoprim]   Social History:  The patient  reports that he has never smoked. He has never used smokeless tobacco. He reports current alcohol use of about 8.0 standard drinks of alcohol per week. He reports that he does not use drugs.   Family History:  The patient's family history includes Alzheimer's disease in his father; Breast cancer in his mother; CAD in his father; Diabetes in his mother; Healthy in his sister; Stroke in his father.   ROS:  Please see the history of present illness.   Otherwise, review of systems is positive for none.   All other systems are reviewed and negative.   PHYSICAL EXAM: VS:  BP 106/62   Pulse 63   Ht '5\' 7"'$  (1.702 m)  Wt 180 lb 6.4 oz (81.8 kg)   SpO2 95%   BMI 28.25 kg/m  , BMI Body mass index is 28.25 kg/m. GEN: Well nourished, well developed, in no acute distress  HEENT: normal  Neck: no JVD, carotid bruits, or masses Cardiac: irregular; no murmurs, rubs, or gallops,no edema  Respiratory:  clear to auscultation bilaterally, normal work of breathing GI: soft, nontender, nondistended, + BS MS: no deformity or atrophy  Skin: warm and dry Neuro:  Strength and sensation are intact Psych: euthymic mood, full affect  EKG:  EKG is ordered today. Personal review of the ekg ordered shows atrial fibrillation   Recent Labs: No results found for requested labs  within last 365 days.    Lipid Panel  No results found for: "CHOL", "TRIG", "HDL", "CHOLHDL", "VLDL", "LDLCALC", "LDLDIRECT"   Wt Readings from Last 3 Encounters:  04/22/22 180 lb 6.4 oz (81.8 kg)  02/26/22 186 lb 6.4 oz (84.6 kg)  10/09/21 184 lb 3.2 oz (83.6 kg)      Other studies Reviewed: Additional studies/ records that were reviewed today include: Cardiac monitor 06/25/2021 personally reviewed Review of the above records today demonstrates: Predominant rhythm was sinus rhythm 3% atrial fibrillation/flutter burden   ASSESSMENT AND PLAN:  1.  Paroxysmal atrial fibrillation: On Eliquis 5 mg twice daily.  CHA2DS2-VASc of at least 5.  Burden has gone up.  Based on his Apple Watch, he is having approximately 50% burden of atrial fibrillation/flutter.  He feels well and is able to do all of his daily activities without fatigue, weakness, shortness of breath.  We did discuss rhythm control, but for now as he is feeling well he has opted for rate control.  I Khloie Hamada see him back in 6 months for further discussions.  2.  Coronary artery disease: Status post CABG.  Plan per primary cardiology.  3.  Hypertension: Currently well-controlled  4.  Hyperlipidemia: Continue Vytorin per primary cardiology  5.  Secondary hypercoagulable state: Currently on Eliquis for atrial fibrillation above  6.  PACs: Causing bradycardia.  Asymptomatic.  Holding off on rate controlling medications at this time.   Current medicines are reviewed at length with the patient today.   The patient does not have concerns regarding his medicines.  The following changes were made today: None  Labs/ tests ordered today include:  Orders Placed This Encounter  Procedures   EKG 12-Lead     Disposition:   FU 6 months  Signed, Esaw Knippel Meredith Leeds, MD  04/22/2022 3:51 PM     [place] [address] [place] [address] [phone] (office) [phone] (fax)

## 2022-05-22 ENCOUNTER — Other Ambulatory Visit: Payer: Self-pay | Admitting: Urology

## 2022-06-23 DIAGNOSIS — I483 Typical atrial flutter: Secondary | ICD-10-CM | POA: Diagnosis not present

## 2022-06-23 DIAGNOSIS — E1169 Type 2 diabetes mellitus with other specified complication: Secondary | ICD-10-CM | POA: Diagnosis not present

## 2022-06-23 DIAGNOSIS — Z79899 Other long term (current) drug therapy: Secondary | ICD-10-CM | POA: Diagnosis not present

## 2022-06-23 DIAGNOSIS — R7989 Other specified abnormal findings of blood chemistry: Secondary | ICD-10-CM | POA: Diagnosis not present

## 2022-06-23 DIAGNOSIS — E7800 Pure hypercholesterolemia, unspecified: Secondary | ICD-10-CM | POA: Diagnosis not present

## 2022-06-23 DIAGNOSIS — I25708 Atherosclerosis of coronary artery bypass graft(s), unspecified, with other forms of angina pectoris: Secondary | ICD-10-CM | POA: Diagnosis not present

## 2022-06-23 DIAGNOSIS — D6869 Other thrombophilia: Secondary | ICD-10-CM | POA: Diagnosis not present

## 2022-06-23 DIAGNOSIS — E222 Syndrome of inappropriate secretion of antidiuretic hormone: Secondary | ICD-10-CM | POA: Diagnosis not present

## 2022-06-23 DIAGNOSIS — M8580 Other specified disorders of bone density and structure, unspecified site: Secondary | ICD-10-CM | POA: Diagnosis not present

## 2022-06-23 DIAGNOSIS — Z0000 Encounter for general adult medical examination without abnormal findings: Secondary | ICD-10-CM | POA: Diagnosis not present

## 2022-06-23 DIAGNOSIS — I700 Atherosclerosis of aorta: Secondary | ICD-10-CM | POA: Diagnosis not present

## 2022-06-23 DIAGNOSIS — R778 Other specified abnormalities of plasma proteins: Secondary | ICD-10-CM | POA: Diagnosis not present

## 2022-06-23 DIAGNOSIS — E1165 Type 2 diabetes mellitus with hyperglycemia: Secondary | ICD-10-CM | POA: Diagnosis not present

## 2022-06-23 DIAGNOSIS — R946 Abnormal results of thyroid function studies: Secondary | ICD-10-CM | POA: Diagnosis not present

## 2022-06-23 DIAGNOSIS — R001 Bradycardia, unspecified: Secondary | ICD-10-CM | POA: Diagnosis not present

## 2022-07-11 DIAGNOSIS — E871 Hypo-osmolality and hyponatremia: Secondary | ICD-10-CM | POA: Diagnosis not present

## 2022-08-11 ENCOUNTER — Other Ambulatory Visit: Payer: Self-pay | Admitting: *Deleted

## 2022-08-11 DIAGNOSIS — D23121 Other benign neoplasm of skin of left upper eyelid, including canthus: Secondary | ICD-10-CM | POA: Diagnosis not present

## 2022-08-11 MED ORDER — METOPROLOL SUCCINATE ER 25 MG PO TB24: 25.0000 mg | ORAL_TABLET | Freq: Every day | ORAL | 2 refills | Status: DC

## 2022-09-15 ENCOUNTER — Telehealth: Payer: Self-pay | Admitting: Cardiology

## 2022-09-15 NOTE — Telephone Encounter (Signed)
Pt says his watch is saying he is in afib 90% of the time, when he wears the watch. He is still very active, played pickle ball for 1 1/2 hours in the heat today.  Says he called in to make an appt and they scheduled him for October but feels he needs something sooner, and was supposed to follow up this summer.  Aware forwarding to EP scheduler to arrange OV in July with Dr. Elberta Fortis  Patient verbalized understanding and agreeable to plan.

## 2022-09-15 NOTE — Telephone Encounter (Signed)
Patient wants to discuss possible ablation.

## 2022-11-18 ENCOUNTER — Encounter: Payer: Self-pay | Admitting: Cardiology

## 2022-11-18 ENCOUNTER — Ambulatory Visit: Payer: Medicare Other | Attending: Cardiology | Admitting: Cardiology

## 2022-11-18 VITALS — BP 122/70 | HR 39 | Ht 67.0 in | Wt 180.4 lb

## 2022-11-18 DIAGNOSIS — I4819 Other persistent atrial fibrillation: Secondary | ICD-10-CM

## 2022-11-18 DIAGNOSIS — I2510 Atherosclerotic heart disease of native coronary artery without angina pectoris: Secondary | ICD-10-CM | POA: Diagnosis not present

## 2022-11-18 DIAGNOSIS — D6869 Other thrombophilia: Secondary | ICD-10-CM | POA: Diagnosis not present

## 2022-11-18 DIAGNOSIS — I10 Essential (primary) hypertension: Secondary | ICD-10-CM | POA: Diagnosis not present

## 2022-11-18 NOTE — Progress Notes (Signed)
Electrophysiology Office Note:   Date:  11/18/2022  ID:  [name], [name], DOB 1940-05-31, MRN [id number]  Primary Cardiologist: Lesleigh Noe, [name] (Inactive) Electrophysiologist: Bresha Hosack Jorja Loa, [name]      History of Present Illness:   [name], [name] is a 82 y.o. male with h/o atrial fibrillation seen today for routine electrophysiology followup.  Since last being seen in our clinic the patient reports doing well.  He has no complaints at this time.  He continues to exercise, playing pickle ball 3 days a week and going to the gym 5 days a week.  He is unaware of his atrial fibrillation.  He states that he can get his heart rate into the low 100s when he exercises.  he denies chest pain, palpitations, dyspnea, PND, orthopnea, nausea, vomiting, dizziness, syncope, edema, weight gain, or early satiety.      He has a history significant for paroxysmal atrial fibrillation, coronary artery disease post CABG in 2010 with LIMA to the LAD, out of hospital cardiac arrest with successful resuscitation, hypertension, diabetes. He wore a cardiac monitor that showed a 3% atrial fibrillation burden he is now on Eliquis.        Review of systems complete and found to be negative unless listed in HPI.   EP Information / Studies Reviewed:    EKG is ordered today. Personal review as below.  EKG Interpretation Date/Time:  Tuesday November 18 2022 12:31:29 EDT Ventricular Rate:  37 PR Interval:    QRS Duration:  78 QT Interval:  508 QTC Calculation: 398 R Axis:   82  Text Interpretation: Atrial fibrillation with slow ventricular response Septal infarct , age undetermined When compared with ECG of 01-Oct-2018 20:34, Atrial fibrillation Confirmed by Lucindia Lemley ([id number]) on 11/18/2022 12:34:46 PM     Risk Assessment/Calculations:    CHA2DS2-VASc Score = 4   This indicates a 4.8% annual risk of stroke. The patient's score is based upon: CHF History: 0 HTN History: 0 Diabetes  History: 1 Stroke History: 0 Vascular Disease History: 1 Age Score: 2 Gender Score: 0             Physical Exam:   VS:  BP 122/70 (BP Location: Left Arm, Patient Position: Sitting, Cuff Size: Normal)   Pulse (!) 39   Ht 5\' 7"  (1.702 m)   Wt 180 lb 6.4 oz (81.8 kg)   SpO2 98%   BMI 28.25 kg/m    Wt Readings from Last 3 Encounters:  11/18/22 180 lb 6.4 oz (81.8 kg)  04/22/22 180 lb 6.4 oz (81.8 kg)  02/26/22 186 lb 6.4 oz (84.6 kg)     GEN: Well nourished, well developed in no acute distress NECK: No JVD; No carotid bruits CARDIAC: Irregularly irregular rate and rhythm, no murmurs, rubs, gallops RESPIRATORY:  Clear to auscultation without rales, wheezing or rhonchi  ABDOMEN: Soft, non-tender, non-distended EXTREMITIES:  No edema; No deformity   ASSESSMENT AND PLAN:    1.  Persistent atrial fibrillation/flutter: Currently on Eliquis.  He remains in atrial fibrillation.  He is feeling well.  He can exercise without issue, plays pickle ball and goes to the gym without problems.  [name] continue with current management.  2.  Coronary artery disease: Status post CABG.  Plan per primary cardiology.  3.  Hypertension: Currently well-controlled  4.  Hyperlipidemia: Continue Vytorin per primary cardiology  5.  Secondary hypercoagulable state: Currently on Eliquis for atrial fibrillation with Xarelto  Follow up with Dr.  Winfred Redel in 6 months  Signed, Cinda Hara Jorja Loa, [name]

## 2022-11-26 DIAGNOSIS — N3001 Acute cystitis with hematuria: Secondary | ICD-10-CM | POA: Diagnosis not present

## 2022-11-26 DIAGNOSIS — R3915 Urgency of urination: Secondary | ICD-10-CM | POA: Diagnosis not present

## 2022-12-24 DIAGNOSIS — E222 Syndrome of inappropriate secretion of antidiuretic hormone: Secondary | ICD-10-CM | POA: Diagnosis not present

## 2022-12-24 DIAGNOSIS — I25708 Atherosclerosis of coronary artery bypass graft(s), unspecified, with other forms of angina pectoris: Secondary | ICD-10-CM | POA: Diagnosis not present

## 2022-12-24 DIAGNOSIS — I700 Atherosclerosis of aorta: Secondary | ICD-10-CM | POA: Diagnosis not present

## 2022-12-24 DIAGNOSIS — I483 Typical atrial flutter: Secondary | ICD-10-CM | POA: Diagnosis not present

## 2022-12-24 DIAGNOSIS — E1169 Type 2 diabetes mellitus with other specified complication: Secondary | ICD-10-CM | POA: Diagnosis not present

## 2022-12-24 DIAGNOSIS — I480 Paroxysmal atrial fibrillation: Secondary | ICD-10-CM | POA: Diagnosis not present

## 2022-12-24 DIAGNOSIS — E1165 Type 2 diabetes mellitus with hyperglycemia: Secondary | ICD-10-CM | POA: Diagnosis not present

## 2023-01-05 ENCOUNTER — Ambulatory Visit: Payer: Medicare Other | Admitting: Cardiology

## 2023-02-13 DIAGNOSIS — D2261 Melanocytic nevi of right upper limb, including shoulder: Secondary | ICD-10-CM | POA: Diagnosis not present

## 2023-02-13 DIAGNOSIS — D225 Melanocytic nevi of trunk: Secondary | ICD-10-CM | POA: Diagnosis not present

## 2023-02-13 DIAGNOSIS — D2271 Melanocytic nevi of right lower limb, including hip: Secondary | ICD-10-CM | POA: Diagnosis not present

## 2023-02-13 DIAGNOSIS — D2272 Melanocytic nevi of left lower limb, including hip: Secondary | ICD-10-CM | POA: Diagnosis not present

## 2023-02-13 DIAGNOSIS — L720 Epidermal cyst: Secondary | ICD-10-CM | POA: Diagnosis not present

## 2023-02-13 DIAGNOSIS — D2262 Melanocytic nevi of left upper limb, including shoulder: Secondary | ICD-10-CM | POA: Diagnosis not present

## 2023-02-13 DIAGNOSIS — L570 Actinic keratosis: Secondary | ICD-10-CM | POA: Diagnosis not present

## 2023-02-13 DIAGNOSIS — Z85828 Personal history of other malignant neoplasm of skin: Secondary | ICD-10-CM | POA: Diagnosis not present

## 2023-02-13 DIAGNOSIS — L814 Other melanin hyperpigmentation: Secondary | ICD-10-CM | POA: Diagnosis not present

## 2023-02-13 DIAGNOSIS — D1801 Hemangioma of skin and subcutaneous tissue: Secondary | ICD-10-CM | POA: Diagnosis not present

## 2023-02-13 DIAGNOSIS — Z85820 Personal history of malignant melanoma of skin: Secondary | ICD-10-CM | POA: Diagnosis not present

## 2023-02-25 DIAGNOSIS — Z961 Presence of intraocular lens: Secondary | ICD-10-CM | POA: Diagnosis not present

## 2023-02-25 DIAGNOSIS — E119 Type 2 diabetes mellitus without complications: Secondary | ICD-10-CM | POA: Diagnosis not present

## 2023-02-25 DIAGNOSIS — D3131 Benign neoplasm of right choroid: Secondary | ICD-10-CM | POA: Diagnosis not present

## 2023-03-10 NOTE — Progress Notes (Unsigned)
No chief complaint on file.  History of Present Illness: 82 yo male with history of CAD s/p 1V CABG in 2010, DM, HLD, HTN and PAF who is here today for cardiac follow up. He has been followed in our office by Dr. Katrinka Blazing. Out of hospital cardiac arrest in 2010 leading to cardiac cath which showed severe disease in the ostial to mid LAD. He underwent single vessel CABG with LIMA to LAD in May 2010. He has been on Eliquis for atrial fibrillation and is followed by Dr. Elberta Fortis in the EP clinic.   He is here today for follow up. The patient denies any chest pain, dyspnea, palpitations, lower extremity edema, orthopnea, PND, dizziness, near syncope or syncope.   Primary Care Physician: Merlene Laughter, MD (Inactive)   Past Medical History:  Diagnosis Date   CAD (coronary artery disease)    Coronary atherosclerosis of unspecified type of vessel, native or graft    Diabetes ([facility])    Hypercholesteremia    Type II or unspecified type diabetes mellitus without mention of complication, not stated as uncontrolled     Past Surgical History:  Procedure Laterality Date   APPENDECTOMY     benign pigmented nevus     CABG Dr. Tyrone Sage  May 2010   fractured left wrist     TONSILLECTOMY      Current Outpatient Medications  Medication Sig Dispense Refill   alfuzosin (UROXATRAL) 10 MG 24 hr tablet Take 1 tablet by mouth daily.     ELIQUIS 5 MG TABS tablet Take 5 mg by mouth 2 (two) times daily.     finasteride (PROSCAR) 5 MG tablet Take 5 mg by mouth daily.     metFORMIN (GLUCOPHAGE-XR) 500 MG 24 hr tablet Take 500 mg by mouth daily with breakfast.      metoprolol succinate (TOPROL-XL) 25 MG 24 hr tablet Take 1 tablet (25 mg total) by mouth daily. 90 tablet 2   Multiple Vitamin (MULTIVITAMIN) capsule Take 1 capsule by mouth daily. 3-5 tabs once weekly     sildenafil (REVATIO) 20 MG tablet Take 20 mg by mouth as needed.     VYTORIN 10-80 MG tablet Take 1 tablet by mouth daily.     No current  facility-administered medications for this visit.    Allergies  Allergen Reactions   Tetracyclines & Related    Bactrim [Sulfamethoxazole-Trimethoprim] Rash    Social History   Socioeconomic History   Marital status: Married    Spouse name: Not on file   Number of children: Not on file   Years of education: Not on file   Highest education level: Not on file  Occupational History   Not on file  Tobacco Use   Smoking status: Never   Smokeless tobacco: Never  Substance and Sexual Activity   Alcohol use: Yes    Alcohol/week: 8.0 standard drinks of alcohol    Types: 4 Cans of beer, 4 Standard drinks or equivalent per week   Drug use: No   Sexual activity: Not on file  Other Topics Concern   Not on file  Social History Narrative   Not on file   Social Determinants of Health   Financial Resource Strain: Not on file  Food Insecurity: Not on file  Transportation Needs: Not on file  Physical Activity: Not on file  Stress: Not on file  Social Connections: Not on file  Intimate Partner Violence: Not on file    Family History  Problem Relation Age  of Onset   Alzheimer's disease Father    CAD Father    Stroke Father    Diabetes Mother    Breast cancer Mother    Healthy Sister     Review of Systems:  As stated in the HPI and otherwise negative.   There were no vitals taken for this visit.  Physical Examination: General: Well developed, well nourished, NAD  HEENT: OP clear, mucus membranes moist  SKIN: warm, dry. No rashes. Neuro: No focal deficits  Musculoskeletal: Muscle strength 5/5 all ext  Psychiatric: Mood and affect normal  Neck: No JVD, no carotid bruits, no thyromegaly, no lymphadenopathy.  Lungs:Clear bilaterally, no wheezes, rhonci, crackles Cardiovascular: Regular rate and rhythm. No murmurs, gallops or rubs. Abdomen:Soft. Bowel sounds present. Non-tender.  Extremities: No lower extremity edema. Pulses are 2 + in the bilateral DP/PT.  EKG:  EKG  {ACTION; IS/IS ONG:[id number]} ordered today. The ekg ordered today demonstrates ***  Recent Labs: No results found for requested labs within last 365 days.   Lipid Panel No results found for: "CHOL", "TRIG", "HDL", "CHOLHDL", "VLDL", "LDLCALC", "LDLDIRECT"   Wt Readings from Last 3 Encounters:  11/18/22 81.8 kg  04/22/22 81.8 kg  02/26/22 84.6 kg    Assessment and Plan:   1. CAD s/p CABG without angina: No chest pain. Continue beta blocker and Vytorin. *** ? Echo  2. HTN: BP is well controlled. No changes today.   3. HLD: Lipids followed in primary care. Continue Vytorin.   4. Persistent atrial fibrillation/flutter: Rate controlled. Continue beta blocker and Eliquis. He is followed in the EP clinic.   Labs/ tests ordered today include:  No orders of the defined types were placed in this encounter.  Disposition:   F/U with me in ***   Signed, Verne Carrow, MD, [facility] 03/10/2023 2:06 PM    [facility] [address] [place], [place], [place]  [address] Phone: [phone]; Fax: [phone]

## 2023-03-11 ENCOUNTER — Encounter: Payer: Self-pay | Admitting: Cardiovascular Disease

## 2023-03-11 ENCOUNTER — Ambulatory Visit: Payer: Medicare Other | Attending: Cardiovascular Disease | Admitting: Cardiovascular Disease

## 2023-03-11 VITALS — BP 132/74 | HR 48 | Ht 67.0 in | Wt 182.6 lb

## 2023-03-11 DIAGNOSIS — I2510 Atherosclerotic heart disease of native coronary artery without angina pectoris: Secondary | ICD-10-CM | POA: Diagnosis not present

## 2023-03-11 DIAGNOSIS — E7800 Pure hypercholesterolemia, unspecified: Secondary | ICD-10-CM | POA: Diagnosis not present

## 2023-03-11 DIAGNOSIS — I10 Essential (primary) hypertension: Secondary | ICD-10-CM | POA: Diagnosis not present

## 2023-03-11 DIAGNOSIS — I480 Paroxysmal atrial fibrillation: Secondary | ICD-10-CM

## 2023-03-11 NOTE — Patient Instructions (Signed)
 Medication Instructions:  No changes *If you need a refill on your cardiac medications before your next appointment, please call your pharmacy*   Lab Work: none   Testing/Procedures: Your physician has requested that you have an echocardiogram. Echocardiography is a painless test that uses sound waves to create images of your heart. It provides your doctor with information about the size and shape of your heart and how well your heart's chambers and valves are working. This procedure takes approximately one hour. There are no restrictions for this procedure. Please do NOT wear cologne, perfume, aftershave, or lotions (deodorant is allowed). Please arrive 15 minutes prior to your appointment time.  Please note: We ask at that you not bring children with you during ultrasound (echo/ vascular) testing. Due to room size and safety concerns, children are not allowed in the ultrasound rooms during exams. Our front office staff cannot provide observation of children in our lobby area while testing is being conducted. An adult accompanying a patient to their appointment will only be allowed in the ultrasound room at the discretion of the ultrasound technician under special circumstances. We apologize for any inconvenience.    Follow-Up: At [facility], you and your health needs are our priority.  As part of our continuing mission to provide you with exceptional heart care, we have created designated Provider Care Teams.  These Care Teams include your primary Cardiologist (physician) and Advanced Practice Providers (APPs -  Physician Assistants and Nurse Practitioners) who all work together to provide you with the care you need, when you need it.   Your next appointment:   12 month(s)  Provider:   Verne Carrow, MD

## 2023-04-02 DIAGNOSIS — H0100B Unspecified blepharitis left eye, upper and lower eyelids: Secondary | ICD-10-CM | POA: Diagnosis not present

## 2023-04-20 ENCOUNTER — Ambulatory Visit (HOSPITAL_COMMUNITY): Payer: Medicare Other | Attending: Cardiology

## 2023-04-20 DIAGNOSIS — I2510 Atherosclerotic heart disease of native coronary artery without angina pectoris: Secondary | ICD-10-CM

## 2023-04-20 LAB — ECHOCARDIOGRAM COMPLETE: S' Lateral: 2.73 cm

## 2023-04-24 ENCOUNTER — Encounter: Payer: Self-pay | Admitting: Cardiovascular Disease

## 2023-04-27 ENCOUNTER — Other Ambulatory Visit: Payer: Self-pay | Admitting: Cardiovascular Disease

## 2023-06-26 DIAGNOSIS — R946 Abnormal results of thyroid function studies: Secondary | ICD-10-CM | POA: Diagnosis not present

## 2023-06-26 DIAGNOSIS — Z0000 Encounter for general adult medical examination without abnormal findings: Secondary | ICD-10-CM | POA: Diagnosis not present

## 2023-06-26 DIAGNOSIS — D6869 Other thrombophilia: Secondary | ICD-10-CM | POA: Diagnosis not present

## 2023-06-26 DIAGNOSIS — I483 Typical atrial flutter: Secondary | ICD-10-CM | POA: Diagnosis not present

## 2023-06-26 DIAGNOSIS — E1165 Type 2 diabetes mellitus with hyperglycemia: Secondary | ICD-10-CM | POA: Diagnosis not present

## 2023-06-26 DIAGNOSIS — M8580 Other specified disorders of bone density and structure, unspecified site: Secondary | ICD-10-CM | POA: Diagnosis not present

## 2023-06-26 DIAGNOSIS — Z79899 Other long term (current) drug therapy: Secondary | ICD-10-CM | POA: Diagnosis not present

## 2023-06-26 DIAGNOSIS — E7800 Pure hypercholesterolemia, unspecified: Secondary | ICD-10-CM | POA: Diagnosis not present

## 2023-06-26 DIAGNOSIS — Z23 Encounter for immunization: Secondary | ICD-10-CM | POA: Diagnosis not present

## 2023-06-26 DIAGNOSIS — R001 Bradycardia, unspecified: Secondary | ICD-10-CM | POA: Diagnosis not present

## 2023-06-26 DIAGNOSIS — N1831 Chronic kidney disease, stage 3a: Secondary | ICD-10-CM | POA: Diagnosis not present

## 2023-06-26 DIAGNOSIS — E222 Syndrome of inappropriate secretion of antidiuretic hormone: Secondary | ICD-10-CM | POA: Diagnosis not present

## 2023-08-12 DIAGNOSIS — H26492 Other secondary cataract, left eye: Secondary | ICD-10-CM | POA: Diagnosis not present

## 2023-11-19 DIAGNOSIS — L720 Epidermal cyst: Secondary | ICD-10-CM | POA: Diagnosis not present

## 2023-12-23 DIAGNOSIS — I483 Typical atrial flutter: Secondary | ICD-10-CM | POA: Diagnosis not present

## 2023-12-23 DIAGNOSIS — D6869 Other thrombophilia: Secondary | ICD-10-CM | POA: Diagnosis not present

## 2023-12-23 DIAGNOSIS — N1831 Chronic kidney disease, stage 3a: Secondary | ICD-10-CM | POA: Diagnosis not present

## 2023-12-23 DIAGNOSIS — E222 Syndrome of inappropriate secretion of antidiuretic hormone: Secondary | ICD-10-CM | POA: Diagnosis not present

## 2023-12-23 DIAGNOSIS — E1169 Type 2 diabetes mellitus with other specified complication: Secondary | ICD-10-CM | POA: Diagnosis not present

## 2023-12-23 DIAGNOSIS — R001 Bradycardia, unspecified: Secondary | ICD-10-CM | POA: Diagnosis not present

## 2023-12-23 DIAGNOSIS — E1165 Type 2 diabetes mellitus with hyperglycemia: Secondary | ICD-10-CM | POA: Diagnosis not present

## 2023-12-23 DIAGNOSIS — I25708 Atherosclerosis of coronary artery bypass graft(s), unspecified, with other forms of angina pectoris: Secondary | ICD-10-CM | POA: Diagnosis not present

## 2023-12-30 ENCOUNTER — Ambulatory Visit: Admitting: Cardiology

## 2024-01-20 DIAGNOSIS — K909 Intestinal malabsorption, unspecified: Secondary | ICD-10-CM | POA: Diagnosis not present

## 2024-01-25 DIAGNOSIS — R310 Gross hematuria: Secondary | ICD-10-CM | POA: Diagnosis not present

## 2024-02-07 NOTE — Progress Notes (Signed)
 " Electrophysiology Office Note:   Date:  02/08/2024  ID:  [name], [name], DOB 18-Feb-1941, MRN [id number]  Primary Cardiologist: Lonni Cash, [name] Primary Heart Failure: None Electrophysiologist: Rosamary Boudreau Gladis Lara, [name]      History of Present Illness:   [name], [name] is a 83 y.o. male with h/o atrial fibrillation, coronary disease post CABG in 2010, cardiac arrest with successful resuscitation, hypertension, diabetes seen today for routine electrophysiology followup.   Discussed the use of AI scribe software for clinical note transcription with the patient, who gave verbal consent to proceed.  History of Present Illness Dr. 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  He is asymptomatic and actively engages in physical activities, including playing pickleball for four hours a week indoors due to cold weather. His resting heart rate is typically in the thirties, and during pickleball, it rarely exceeds 100, usually staying around 87 to 90. He experiences no limitations or symptoms during these activities.  He recalls being informed by a previous doctor about having bradycardia-tachycardia syndrome and believes he may eventually require a pacemaker. His cardiac output on a previous echocardiogram was reported to be 60 to 65 percent. He also mentions that his Apple Watch frequently indicates he is in atrial fibrillation, with readings varying from 64 to 90 percent, although he acknowledges the device is not a medical instrument.  He and his wife are actively involved in caring for their grandchildren, which includes picking them up from school and looking after them until the evening. He notes feeling tired at the end of the day due to these activities. He and his wife walked 12,000 steps at [facility] recently, indicating a high level of physical activity. He humorously notes that his peer group  is getting smaller.    he denies chest pain, palpitations, dyspnea, PND, orthopnea, nausea, vomiting, dizziness, syncope, edema, weight gain, or early satiety.   Review of systems complete and found to be negative unless listed in HPI.   EP Information / Studies Reviewed:    EKG is ordered today. Personal review as below.  EKG Interpretation Date/Time:  Monday February 08 2024 10:23:23 EST Ventricular Rate:  36 PR Interval:    QRS Duration:  76 QT Interval:  536 QTC Calculation: 414 R Axis:   39  Text Interpretation: Atrial fibrillation with slow ventricular response Septal infarct (cited on or before 18-Nov-2022) When compared with ECG of 18-Nov-2022 12:31, No significant change was found Confirmed by Deval Mroczka ([id number]) on 02/08/2024 10:56:15 AM    Risk Assessment/Calculations:    CHA2DS2-VASc Score = 4   This indicates a 4.8% annual risk of stroke. The patient's score is based upon: CHF History: 0 HTN History: 0 Diabetes History: 1 Stroke History: 0 Vascular Disease History: 1 Age Score: 2 Gender Score: 0             Physical Exam:   VS:  BP 130/66 (BP Location: Left Arm, Patient Position: Sitting, Cuff Size: Normal)   Pulse (!) 36   Ht 5' 7 (1.702 m)   Wt 179 lb 14.4 oz (81.6 kg)   SpO2 98%   BMI 28.18 kg/m    Wt Readings from Last 3 Encounters:  02/08/24 179 lb 14.4 oz (81.6 kg)  03/11/23 182 lb 9.6 oz (82.8 kg)  11/18/22 180 lb 6.4 oz (81.8 kg)     GEN: Well nourished, well developed in no acute distress NECK:  No JVD; No carotid bruits CARDIAC: Regular rate and rhythm, no murmurs, rubs, gallops RESPIRATORY:  Clear to auscultation without rales, wheezing or rhonchi  ABDOMEN: Soft, non-tender, non-distended EXTREMITIES:  No edema; No deformity   ASSESSMENT AND PLAN:    1.  Persistent atrial fibrillation/flutter: On Eliquis.  Remains in atrial fibrillation and feeling well.  His heart rates are slow, but he can continue to exercise, playing pickle  ball 4 days a week and walking around country [place].  Jennet Scroggin continue monitoring.  2.  Coronary artery disease: Post CABG.  Plan per primary cardiology  3.  Hypertension: Well-controlled  4.  Hyperlipidemia: On Vytorin per primary care  5.  Secondary to coagulable state: On Eliquis  Follow up with EP Team in 12 months  Signed, Arcenia Scarbro Gladis Lara, [name]  "

## 2024-02-08 ENCOUNTER — Ambulatory Visit: Attending: Cardiology | Admitting: Cardiology

## 2024-02-08 ENCOUNTER — Encounter: Payer: Self-pay | Admitting: Cardiology

## 2024-02-08 VITALS — BP 130/66 | HR 36 | Ht 67.0 in | Wt 179.9 lb

## 2024-02-08 DIAGNOSIS — I4819 Other persistent atrial fibrillation: Secondary | ICD-10-CM

## 2024-02-08 DIAGNOSIS — I2510 Atherosclerotic heart disease of native coronary artery without angina pectoris: Secondary | ICD-10-CM

## 2024-02-08 DIAGNOSIS — I4821 Permanent atrial fibrillation: Secondary | ICD-10-CM | POA: Diagnosis not present

## 2024-02-08 DIAGNOSIS — E7800 Pure hypercholesterolemia, unspecified: Secondary | ICD-10-CM

## 2024-02-08 DIAGNOSIS — I10 Essential (primary) hypertension: Secondary | ICD-10-CM | POA: Diagnosis not present

## 2024-02-08 MED ORDER — FLECAINIDE ACETATE 100 MG PO TABS: 100.0000 mg | ORAL_TABLET | Freq: Two times a day (BID) | ORAL | 3 refills | Status: DC

## 2024-02-08 NOTE — Patient Instructions (Addendum)
 Medication Instructions:  Your physician recommends that you continue on your current medications as directed. Please refer to the Current Medication list given to you today.  *If you need a refill on your cardiac medications before your next appointment, please call your pharmacy*  Follow-Up: At [facility], you and your health needs are our priority.  As part of our continuing mission to provide you with exceptional heart care, our providers are all part of one team.  This team includes your primary Cardiologist (physician) and Advanced Practice Providers or APPs (Physician Assistants and Nurse Practitioners) who all work together to provide you with the care you need, when you need it.  Your next appointment:   1 year  Provider:   You may see Will [name], MD or one of the following Advanced Practice Providers on your designated Care Team:   Charlies Arthur, [place] Ozell Jodie Passey, PA-C Suzann Riddle, NP Daphne Barrack, NP Artist Pouch, PA-C

## 2024-02-08 NOTE — Telephone Encounter (Signed)
 Confirmed that [facility] shows flecainide has been discontinued in their system.  Printed corrected AVS and contacted patient. Pt confirmed he is able to see the corrected AVS in MyChart now. Apologized for his experience and thanked him for reaching back out. Pt expressed appreciation of call and denies additional concerns at this time.

## 2024-03-08 ENCOUNTER — Telehealth: Payer: Self-pay | Admitting: Cardiovascular Disease

## 2024-03-08 NOTE — Telephone Encounter (Signed)
 Pt cancelled annual f/u with [name] 12/16 due to son had kidney surgery and struggling with infection. Pt would like a c/b from nurse about anyway he can see [name] before next quarters schedule. Please advise.

## 2024-03-08 NOTE — Telephone Encounter (Signed)
 I spoke with patient and scheduled him to see Dr Verlin on 05/04/24 at 8:20

## 2024-03-15 ENCOUNTER — Ambulatory Visit: Admitting: Cardiovascular Disease

## 2024-04-10 ENCOUNTER — Other Ambulatory Visit: Payer: Self-pay | Admitting: Cardiovascular Disease

## 2024-05-01 ENCOUNTER — Other Ambulatory Visit: Payer: Self-pay | Admitting: Cardiovascular Disease

## 2024-05-04 ENCOUNTER — Ambulatory Visit: Admitting: Cardiovascular Disease

## 2024-05-04 VITALS — BP 126/60 | HR 42 | Ht 67.0 in | Wt 183.0 lb

## 2024-05-04 DIAGNOSIS — I2510 Atherosclerotic heart disease of native coronary artery without angina pectoris: Secondary | ICD-10-CM | POA: Diagnosis not present

## 2024-05-04 DIAGNOSIS — E7800 Pure hypercholesterolemia, unspecified: Secondary | ICD-10-CM

## 2024-05-04 DIAGNOSIS — I4819 Other persistent atrial fibrillation: Secondary | ICD-10-CM | POA: Diagnosis not present

## 2024-05-04 DIAGNOSIS — I10 Essential (primary) hypertension: Secondary | ICD-10-CM

## 2024-05-04 NOTE — Patient Instructions (Signed)
 Medication Instructions:  The current medical regimen is effective;  continue present plan and medications.  *If you need a refill on your cardiac medications before your next appointment, please call your pharmacy*  Follow-Up: At [facility], you and your health needs are our priority.  As part of our continuing mission to provide you with exceptional heart care, our providers are all part of one team.  This team includes your primary Cardiologist (physician) and Advanced Practice Providers or APPs (Physician Assistants and Nurse Practitioners) who all work together to provide you with the care you need, when you need it.  Your next appointment:   1 year(s)  Provider:   Lonni Cash, MD    We recommend signing up for the patient portal called MyChart.  Sign up information is provided on this After Visit Summary.  MyChart is used to connect with patients for Virtual Visits (Telemedicine).  Patients are able to view lab/test results, encounter notes, upcoming appointments, etc.  Non-urgent messages can be sent to your provider as well.   To learn more about what you can do with MyChart, go to [email or web address].
